# Patient Record
Sex: Male | Born: 2000 | Hispanic: No | Marital: Single | Smoking: Never smoker
Health system: Southern US, Community
[De-identification: ages and names within clinical notes are randomized; demographics above are authoritative.]

## PROBLEM LIST (undated history)

## (undated) MED ORDER — LIDOCAINE ATOMIZER SYRINGE 4% SOLUTION (~~LOC~~)
0.5000 mL | Freq: Once | Status: AC
Start: 2023-03-26 — End: 2023-03-26

## (undated) MED ORDER — OXYMETAZOLINE HCL 0.05 % NA SOLN
2.0000 | Freq: Once | NASAL | Status: AC
Start: 2023-03-26 — End: 2023-03-26

## (undated) MED ORDER — LIDOCAINE VISCOUS 2 % MT SOLN
15.0000 mL | Freq: Once | OROMUCOSAL | Status: AC
Start: 2023-03-26 — End: 2023-03-26

---
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
## 2021-01-29 DIAGNOSIS — R1312 Dysphagia, oropharyngeal phase: Secondary | ICD-10-CM | POA: Diagnosis not present

## 2021-01-29 DIAGNOSIS — R070 Pain in throat: Secondary | ICD-10-CM | POA: Diagnosis not present

## 2021-02-22 ENCOUNTER — Other Ambulatory Visit: Payer: Self-pay | Admitting: Family Medicine

## 2021-02-22 ENCOUNTER — Emergency Department (INDEPENDENT_AMBULATORY_CARE_PROVIDER_SITE_OTHER): Admitting: Student in an Organized Health Care Education/Training Program

## 2021-02-22 ENCOUNTER — Ambulatory Visit (INDEPENDENT_AMBULATORY_CARE_PROVIDER_SITE_OTHER)

## 2021-02-22 VITALS — BP 109/66 | HR 55 | Temp 98.7°F | Resp 18

## 2021-02-22 DIAGNOSIS — H6091 Unspecified otitis externa, right ear: Secondary | ICD-10-CM

## 2021-02-22 DIAGNOSIS — H6123 Impacted cerumen, bilateral: Secondary | ICD-10-CM

## 2021-02-22 DIAGNOSIS — H938X3 Other specified disorders of ear, bilateral: Secondary | ICD-10-CM

## 2021-02-22 LAB — COVID-19 CORONAVIRUS DETECTION ASSAY ASYMPTOMATIC: COVID-19 Coronavirus by PCR – External Lab: NOT DETECTED

## 2021-02-22 LAB — COVID-19 RAPID NAAT (POCT): COVID-19 Rapid Assay (POCT): NOT DETECTED

## 2021-02-22 MED ORDER — NEOMYCIN-POLYMYXIN-HC 3.5-10000-1 OT SUSP
3.0000 [drp] | Freq: Four times a day (QID) | OTIC | 0 refills | Status: AC
Start: 2021-02-22 — End: 2021-03-01

## 2021-02-22 NOTE — Patient Instructions (Signed)
Please take drops as prescribed.  And take over-the-counter decongestants.  Please follow-up with primary care if he continued to have ear pain or drainage.

## 2021-02-22 NOTE — Interdisciplinary (Signed)
Called pt for intake questions. Pt waiting in car.

## 2021-02-22 NOTE — Progress Notes (Signed)
Urgent Care Note    CC :   Chief Complaint   Patient presents with    Ear Pain     Bilat ear pain x2d    Congestion     x2d       HPI :   20 year old  male who presents with bilateral ear congestion and nasal congestion.  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  States that since getting off the plane 2 days ago he has had ear congestion and as well as a mild cough and nasal congestion.  No fever no shortness of breath no chest pain no rashes patient did have ear infections as a child but none recently.  Did not swim in the ocean.  No other medical complaints.      Past Medical History : No past medical history on file.    Past Surgical history :   No past surgical history on file.         What To Do With Your Medications      as of February 22, 2021  3:07 PM     You have not been prescribed any medications.         Allergies :  Patient has no known allergies.    Social History :   Social History     Socioeconomic History    Marital status: Single     Spouse name: Not on file    Number of children: Not on file    Years of education: Not on file    Highest education level: Not on file   Occupational History    Not on file   Tobacco Use    Smoking status: Not on file    Smokeless tobacco: Not on file   Substance and Sexual Activity    Alcohol use: Not on file    Drug use: Not on file    Sexual activity: Not on file   Other Topics Concern    Not on file   Social History Narrative    Not on file     Social Determinants of Health     Financial Resource Strain: Not on file   Food Insecurity: Not on file   Transportation Needs: Not on file   Physical Activity: Not on file   Stress: Not on file   Social Connections: Not on file   Intimate Partner Violence: Not on file   Housing Stability: Not on file       Family history  :  No family history on file.      Review of Systems   As per HPI, all other pertinent systems reviewed and negative.      Physical Exam :     02/22/21  1452   BP: 109/66   Pulse: 55   Resp: 18   Temp: 98.7 F (37.1 C)   SpO2: 100%     Vitals signs noted and reviewed.  GENERAL APPEARANCE: nad, A&O x 3   HEENT: no external evidence of trauma, mucous membranes moist bilateral cerumen impaction   CHEST : no obvious deformity  RESP: normal effort and rate  EXTREMITIES: No significant deformity or joint abnormality.  NEUROLOGICAL: CN II-XII grossly intact. Moving all extremities, following commands appropriately, gait normal  SKIN:    Warm and dry  PSYCHIATRIC: A&Ox3.  Normal affect.              Clinical  Decision Making  Previous or care everywhere records were reviewed, if available.  Lab studies ordered:  COVID point of care  Radiology ordered:  None  Referred to ED: no    Patient presents with bilateral ear congestion as well as nasal congestion.  COVID test negative.  Patient ears lavaged in afterward patient does have some irritation in the bilateral ear canals possible early otitis externa on the right side.  Will place patient on ear drops and recommend over-the-counter decongestant to help with nasal congestion and possible mild viral illness.  All results discussed with patient at bedside and with patient's mother.  Patient discharged with strict return precautions.    This note was created using voice recognition technology.  Due to environmental circumstances and may contain errors.  The errors include but are not limited to grammatical errors, punctuation errors, spelling errors, etc.

## 2021-05-30 DIAGNOSIS — H52223 Regular astigmatism, bilateral: Secondary | ICD-10-CM | POA: Diagnosis not present

## 2021-06-13 DIAGNOSIS — G5620 Lesion of ulnar nerve, unspecified upper limb: Secondary | ICD-10-CM | POA: Diagnosis not present

## 2021-06-14 DIAGNOSIS — G5621 Lesion of ulnar nerve, right upper limb: Secondary | ICD-10-CM | POA: Diagnosis not present

## 2021-06-14 DIAGNOSIS — G5620 Lesion of ulnar nerve, unspecified upper limb: Secondary | ICD-10-CM | POA: Insufficient documentation

## 2021-06-14 DIAGNOSIS — G5622 Lesion of ulnar nerve, left upper limb: Secondary | ICD-10-CM | POA: Diagnosis not present

## 2021-06-14 DIAGNOSIS — M5412 Radiculopathy, cervical region: Secondary | ICD-10-CM | POA: Diagnosis not present

## 2021-06-14 DIAGNOSIS — R200 Anesthesia of skin: Secondary | ICD-10-CM | POA: Diagnosis not present

## 2021-07-10 ENCOUNTER — Telehealth (INDEPENDENT_AMBULATORY_CARE_PROVIDER_SITE_OTHER): Payer: Self-pay

## 2021-07-10 NOTE — Telephone Encounter (Signed)
IT consultant from [facility]. Unable to process, missing information. Fax has been scanned to media. (Medical Records)

## 2021-07-15 ENCOUNTER — Telehealth (INDEPENDENT_AMBULATORY_CARE_PROVIDER_SITE_OTHER): Payer: Self-pay

## 2021-07-15 DIAGNOSIS — R29898 Other symptoms and signs involving the musculoskeletal system: Secondary | ICD-10-CM

## 2021-07-15 DIAGNOSIS — R200 Anesthesia of skin: Secondary | ICD-10-CM

## 2021-07-15 NOTE — Telephone Encounter (Signed)
Patient is being referred to neurology  for upper extremity pain/ weakness and numbness     Referring Provider: self referred    Patients PCP (update PCP on chart if needed) : n/a    Please review and advise on how to proceed with patient    Internal or External Referral external         If external referral- were referral & notes scanned yes    Authorization expiration date: n/a     Best Call Back Number: [phone]    Best Call Back Time: anytime    Is it ok to leave a message? yes     Was pt informed of turnaround for review? yes      Father states he was already seen by general neurology and pt needs to be seen by neuromuscular

## 2021-07-16 NOTE — Telephone Encounter (Signed)
[facility] called pt to inform message below, no answer, and not able to leave voicemail as mailbox is full. If pt calls back, inform message below, pt is currently scheduled with General Neurology but per message below, sports medicine is better option.     Noting as Pharmacist, hospital

## 2021-07-17 ENCOUNTER — Encounter (INDEPENDENT_AMBULATORY_CARE_PROVIDER_SITE_OTHER): Payer: Self-pay | Admitting: Hospital

## 2021-07-19 ENCOUNTER — Other Ambulatory Visit: Payer: Self-pay

## 2021-07-19 ENCOUNTER — Encounter (INDEPENDENT_AMBULATORY_CARE_PROVIDER_SITE_OTHER): Payer: Self-pay | Admitting: Nurse Practitioner

## 2021-07-19 ENCOUNTER — Ambulatory Visit (INDEPENDENT_AMBULATORY_CARE_PROVIDER_SITE_OTHER): Admitting: Nurse Practitioner

## 2021-07-19 VITALS — BP 102/67 | HR 51 | Temp 97.1°F | Ht 65.87 in

## 2021-07-19 DIAGNOSIS — M5412 Radiculopathy, cervical region: Secondary | ICD-10-CM

## 2021-07-19 DIAGNOSIS — H0011 Chalazion right upper eyelid: Secondary | ICD-10-CM

## 2021-07-19 MED ORDER — MULTIVITAMIN ADULT PO: 1.0000 | Freq: Every day | ORAL | Status: AC

## 2021-07-19 NOTE — Progress Notes (Signed)
[facility]     Subjective   Elijah Park is a 21 year old male who presents to clinic for Eye Problem (RT eye stye x 5 months )      HPI   Pt c/o stye to R upper eyelid x71mo  Has tried warm compresses multiple times a day w no relief   Stye has become firm, minimal pain but feels it   Denies interference w vision   No associated swelling or eye pain     Also requesting referral to ortho  H/o back trauma after doing a back flip 10/2020, fx'd T5-6, since then still having pain and new n/t to arms.  [place]ly [place] month [place] and would like a referral for f/u here  Pt also tried making a neurology appt but received a call back from the clinic stating that an ortho referral would be more appropriate  No acute changes or concerns today        PHQ9 TOTAL SCORE 07/19/2021   PHQ9 Patient Summary Score (calculated) 11     [place] PHQ9 DEPRESSION QUESTIONNAIRE 07/19/2021   Interest 2   Depressed 2   Sleep 3   Energy 0   Appetite 2   Failure 2   Concentration 0   Movement 0   Suicide 0   Summary(Manual) --   Summary(Calculated) 11   Functional Not difficult at all   pt denies active SI/HI today.  Declines CAPS referral or a SM providing information regarding CAPS resources.  Declines to further discuss mental health     REVIEW OF SYSTEMS      Review of Systems:  General - no fevers, chills, fatigue, unintentional weight loss, headache  Lungs - no cough, shortness of breath, hemoptysis, wheezing  Heart - no chest pain, pressure, irregular heartbeat/palpitations, leg swelling  GI- no abdominal pain, nausea, vomiting, diarrhea, bloating, rectal pain, rectal bleeding, constipation   Neurologic -+n/t no weakness, roblems walking  MusculoSkeletal -+back pain  no new joint pain, joint swelling, or warmth/stiffness  Skin - +stye no rash, no changing skin lesions, itching  Psychiatric - no depression, behavior changes, significant anxiety or sleep disturbance      Outpatient  Medications Prior to Visit   Medication Sig Dispense Refill   . Multiple Vitamin (MULTIVITAMIN ADULT PO) 1 tablet by Oral route daily.       No facility-administered medications prior to visit.     Immunization History   Administered Date(s) Administered   . (Chicken Pox) Varicella Vaccine 04/11/2003, 04/29/2007   . COVID-19 ([employer] Purple Cap >= 12 Years 08/17/2019, 09/07/2019, 06/04/2020   . HPV Unspecified Formulation 05/28/2016, 06/04/2017   . Hep-A Ped/Adol 2 Dose Schedule 05/22/2005, 05/06/2006   . Hepatitis B Vaccine Adjuvanted (Adult 18 Yrs and Older) 12003/06/11 05/09/2002, 01/11/2003   . Inactivated Polio Vaccine (IPV) 06/21/2002, 08/10/2002, 04/11/2003, 04/29/2007   . MMR 04/11/2003, 04/29/2007   . Meningococcal B (Bexsero) 2-dose vaccine 07/12/2018, 08/03/2019   . Meningococcal Vaccine 05/16/2013, 07/12/2018   . Pneumococcal 13 Vaccine (PREVNAR-13) 07/12/2003   . Tdap 05/16/2013     No Known Allergies  There are no problems to display for this patient.    No past medical history on file.  No past surgical history on file.  No family history on file.  No family status information on file.     Objective:  Vitals:    07/19/21 09323  BP: 102/67   BP Location: Left arm   BP Patient Position: Sitting   BP cuff size: Regular   Pulse: 51   Temp: 97.1 F (36.2 C)   TempSrc: Oral   Height: 5' 5.87" (1.673 m)     No LMP for male patient.  There is no height or weight on file to calculate BMI.  '@LABS'$ @    Wt Readings from Last 5 Encounters:   No data found for Wt     Blood Pressure   07/19/21 102/67   02/22/21 109/66         PHYSICAL EXAMINATION      Physical Exam  Constitutional:       Appearance: Normal appearance.   Eyes:      Extraocular Movements: Extraocular movements intact.      Conjunctiva/sclera: Conjunctivae normal.      Pupils: Pupils are equal, round, and reactive to light.     Cardiovascular:      Rate and Rhythm: Normal rate.   Pulmonary:      Effort: Pulmonary effort is normal.   Neurological:       Mental Status: He is alert.   Psychiatric:         Mood and Affect: Mood normal.         Behavior: Behavior normal.            Labs:  Results for orders placed or performed in visit on 02/22/21   COVID-19 Coronavirus Detection Assay Asymptomatic   Result Value Ref Range    COVID-19 Coronavirus by PCR - External Lab Not Detected      Imaging:  No results found.      ASSESSMENT AND PLAN    1. Chalazion of right upper eyelid  - Consult/Referral to Ophthalmology Clinic    2. Cervical radiculopathy  - Consult/Referral to Orthopedics      There are no Patient Instructions on file for this visit.      Patient Instruction: See Patient Education Section    Medication Review:  Medications reviewed with patient and medication list reconciled.  Over the counter medications, herbal therapies and supplements reviewed.  Patient's understanding and response to medications assessed.   Barriers to medications assessed and addressed.   Risks, benefits, alternatives to medications reviewed.      No barriers to learning, verbalizes understanding of teaching and instructions.    Health Maintenance   Topic Date Due   . COVID-19 Vaccine (4 - Booster for Pfizer series) 07/30/2020   . Influenza (1) Never done   . [place] TDAP FOR STUDENT COMPLIANCE  Completed   . [place] VARICELLA FOR STUDENT COMPLIANCE  Completed   . [place] MMR FOR STUDENT COMPLIANCE  Completed   . [place] MENINGOCOCCAL FOR STUDENT COMPLIANCE  Completed         FOLLOW UP     Return or call for any problems, new or worse symptoms: phone [phone]     Call 911 or go to the ER if an emergency  ER precautions discussed including but not limited to worsening pain, chest pain, shortness of breath, significant weakness or dizziness, severe vomiting or diarrhea.   Go to the  Urgent Care on [facility] in [facility] or [place] or ANY local urgent care/ER for new/worsening problems or if having problems after-hours    Future Appointments   Date Time Provider  [place]   12/04/2021  8:00 AM Marva Panda, MD EX[facility]  Tyannah Sane, NP

## 2021-07-23 ENCOUNTER — Other Ambulatory Visit (HOSPITAL_COMMUNITY): Payer: Self-pay

## 2021-07-23 ENCOUNTER — Encounter (HOSPITAL_BASED_OUTPATIENT_CLINIC_OR_DEPARTMENT_OTHER): Payer: Self-pay | Admitting: Nurse Practitioner

## 2021-07-23 DIAGNOSIS — M549 Dorsalgia, unspecified: Secondary | ICD-10-CM

## 2021-08-01 ENCOUNTER — Encounter (HOSPITAL_BASED_OUTPATIENT_CLINIC_OR_DEPARTMENT_OTHER): Payer: Self-pay | Admitting: Nurse Practitioner

## 2021-08-01 ENCOUNTER — Ambulatory Visit: Attending: Nurse Practitioner | Admitting: Nurse Practitioner

## 2021-08-01 ENCOUNTER — Other Ambulatory Visit: Payer: Self-pay

## 2021-08-01 VITALS — BP 115/62 | HR 53 | Temp 97.2°F | Resp 16 | Ht 65.87 in | Wt 108.0 lb

## 2021-08-01 DIAGNOSIS — G5620 Lesion of ulnar nerve, unspecified upper limb: Secondary | ICD-10-CM | POA: Diagnosis not present

## 2021-08-01 DIAGNOSIS — G5623 Lesion of ulnar nerve, bilateral upper limbs: Secondary | ICD-10-CM

## 2021-08-01 DIAGNOSIS — M5412 Radiculopathy, cervical region: Secondary | ICD-10-CM | POA: Insufficient documentation

## 2021-08-01 DIAGNOSIS — M5410 Radiculopathy, site unspecified: Secondary | ICD-10-CM | POA: Insufficient documentation

## 2021-08-01 DIAGNOSIS — R29898 Other symptoms and signs involving the musculoskeletal system: Secondary | ICD-10-CM | POA: Insufficient documentation

## 2021-08-01 NOTE — Progress Notes (Signed)
Division of Spine Surgery          Requesting Provider: Clarnce Flock    Reason for Visit: New Patient (Cervical radiculopathy )      History Of Present Illness: 21 year old male complains of bilateral grip weakness which has progressed since December as well as some neck pain which started recently.   Starting May 09, 2021 he started to notice issues gripping and was dropping things. For example holding weights in the gym was hard for him. He has significant neck stiffness and loss of range of motion as well which started in January. He has numbness in the ulnar nerve distribution into the forearms described as pins and needles in the bilateral hands. This comes and goes and is often described as being worse at night when he is sleeping with his elbow bent. A change in position relieves his symptoms.     EMG done 06/14/21.    He is from [place] where he was getting treatment following his T5/6 fractures last may but is now a Ship broker at [employer].   May 2022 fractured T5 and T6. He back flipped and landed on his head. He still has some tenderness in this area     Denies issues with gait or dexterity.   The patient denies changes in bowel or bladder function.    He has done no physical therapy since the onset of symptoms or the injury.       Prior Treatments:   Oral Medications: tried gabapentin but this made his arms really tired and did not give relief.  Physical Therapy: none    Chiropractic: -  Acupuncture: -  Other: none  Injections: none  Surgery: -       Past Medical History:  has no past medical history on file.    Past Surgical History:  has no past surgical history on file.    Medications:   Current Outpatient Medications   Medication Sig   . Multiple Vitamin (MULTIVITAMIN ADULT PO) 1 tablet by Oral route daily.     No current facility-administered medications for this visit.       Allergies: No Known Allergies    Social History:  reports that he has never smoked. He has never used smokeless  tobacco.    Family History: family history is not on file.      Physical Examination:  CONSTITUTIONAL: Well appearing and well groomed. PSYCHOLOGICAL: Alert and oriented and appropriate to situation. INTEGUMENT: Intact. VASCULAR: Radial and pedal pulses are intact and symmetric. EXTREMITIES: Bilateral upper and lower extremities have good range of motion and no significant deformities. GAIT: Brisk with good coordination.     CERVICAL SPINE: Nontender to palpation. Spurling's test negative. Sensation intact of the upper extremities.    RANGE OF MOTION (in degrees)       RT LT  Lateral motion   80  80  Lateral bend   45  45  Flexion 40 degrees. Extension 35 degrees. Reports some mild pain with lateral range of motion extremes.     MOTOR    RT LT  Trapezius (C3/4)  5/5  5/5  Deltoid (C5)   5/5  5/5  Bicep (C5/6)                           5/5       5/5  Wrist extensor (C6)  5/5  5/5   Wrist flexor (C7)  5/5  5/5  Tricep (C7)                         5/5  5/5  Grip (C8)   4+/5  4+/5  Intrinsics (T1)   5/5  5/5    DEEP TENDON REFLEXES       RT  LT  Bicep (C5/6)   2+  2+  Brachioradialis (C6)  2+  2+  Tricep (C7)   2+  2+    PATHOLOGIC REFLEXES      RT LT  Clonus    Neg Neg  Hoffmans   Neg Neg    THORACIC SPINE  Mild paraspinal muscle tenderness. Alignment - normal, no paravertebral muscle fullness. Sensation intact.    He does have positive elbow flexion test with mild Tinel's at the medial elbows bilaterally.  He has negative Tinel's at the volar wrist and negative carpal tunnel compression test.    IMAGING STUDIES:     MRI CERVICAL SPINE OSH:        MRI THORACIC SPINE OSH:      EMG 06/14/21 OSH:      IMPRESSION:   C8 radiculopathies per EMG without clinical evidence of etiology on MRI  Mild cubital tunnel syndrome bilateral  History of T5 and T6 vertebral fractures May 2022  Decreased grip strength    PLAN:   I had a long discussion with him today regarding his diagnosis and after a discussion of the natural history of  this, as well as surgical options.  He has interesting presentation with primary complaints of decreased grip strength bilaterally.  An EMG showed C8 radiculopathies bilaterally however clinically on his MRI he has no evidence of stenosis or nerve compression.  He does have some mild cubital tunnel bilaterally.  His primary complaints of hand numbness are in the night which resolve with positional changes which makes me think that his symptoms are more related to cubital tunnel.  The grip strength is difficult to decipher given his MRI findings.    At this point I would recommend that he start physical therapy to see if he gets benefit and his symptoms improved.  In addition moving forward I may refer him to an upper extremity specialist for their input on his cubital tunnel.  We discussed cubital tunnel exercises as well as maintaining elbow extension specifically at night.    All questions were answered and Elijah Park understood and was satisfied with this plan.     FOLLOWUP: Return to clinic 2-3 months or sooner as needed. The patient is encouraged to call us with any questions or problems in the interim. Our contact numbers were given to the patient.     On follow up he  will not need repeat radiographs.    I spent a total of 45 minutes today to provide care for this patient. This includes time spent prior to, during, and after the patient's appointment to:   Review the patient's past medical history as well as any relevant prior testing/laboratory/imaging results in preparation for the visit.    Obtain an adequate history and understanding of the patient's chief complaint.   Perform the necessary examination and/or order any tests, labs, or imaging for further evaluation.   Discuss the plan and any differential/diagnoses with the patient. Counsel/educate the patient if needed.   Coordinate care for the patient if needed.     Kandra Nicolas, NP   Orthopaedic Spine Surgery  Dictation software  disclaimer: The note above was dictated, at least in part, with Dragon dictation software. Although the note was thoroughly reviewed and edited for accuracy, occasional errors of word substitution or omission may occur.

## 2021-08-05 ENCOUNTER — Encounter (HOSPITAL_COMMUNITY): Payer: Self-pay

## 2021-08-13 ENCOUNTER — Telehealth (HOSPITAL_BASED_OUTPATIENT_CLINIC_OR_DEPARTMENT_OTHER): Payer: Self-pay | Admitting: Nurse Practitioner

## 2021-08-13 NOTE — Telephone Encounter (Signed)
Tressia Miners NP ending clinic at 3:00 4/27. Rescheduled patient to 10:30am. Called patient, cannot leave vm, sent mychart message informing of change.

## 2021-08-15 NOTE — Interdisciplinary (Signed)
Physical Therapy Evaluation    Ordering Physician: Judeen Hammans    Visit Diagnosis:    ICD-10-CM ICD-9-CM    1. Cubital tunnel syndrome, unspecified laterality  G56.20 354.2       2. Radiculopathy affecting upper extremity  M54.10 723.4       3. Decreased grip strength  R29.898 729.89         Visit Number: 1     Insurance: [place] [employer]    No past medical history on file.    No past surgical history on file.    Allergies: Patient has no known allergies.    Current Outpatient Medications   Medication Sig Dispense Refill   . Multiple Vitamin (MULTIVITAMIN ADULT PO) 1 tablet by Oral route daily.       No current facility-administered medications for this visit.     Start of Care: 08/15/2021           SUBJECTIVE    History of presenting condition: Started experiencing bilateral grip weakness that has been worsening since Dec 2022. Pt was diagnosed with cubital tunnel syndrome.    May 2022 fractured T5 and T6. He back flipped and landed on his head. He still has some tenderness in this area. Pt is not sure if there is correlation with the throacic spine fracture     Recent onset of neck pain with decrease range of motion and stiffness only when the hand symptoms are worse. Numbness in ulnar nerve distribution into the forearms. Symptom worse at night when he is sleeping with his elbow bent. Morning are the worst. Sometimes if he grips tightly the fingers spasm. Stress ball worsened symptoms     He is from [place] where he was getting treatment following his T5/6 fractures last may but is now a Ship broker at [employer].     Symptom progression since onset: stable since onset     Pain levels:  Pain Location: Right > Left hand numbness     Pain Quality: weakness, lack of strength   Intensity: Current:  5/10      At best: 4/10                 At worst: 8/10 (after sleeping with elbow bent)  Frequency: Intermittent    Numbness and Tingling: yes - on the 4th and 5th digit   What increases symptoms:  sleeping with elbows  bent   What decreases symptoms:  elbow brace (stopped in 2 days due to affecting sleep)   Pain Irritability: Moderate  24 Hour Pattern: Yes- morning   Sleep Disturbance: No    Previous treatment for condition: none   Diagnostic tests: EMG  and MRI   EMG testing showed C8 radiculopathies, but no evidence of stenosis or nerve compression on MRI. Mild cubital tunnel bilaterally   Occupation: Ship broker at [employer] Status: Student  Prior level of function: Independent-No Deficits  Functional limitations: sleeping, loss of grip strength- hard to grip phone, glasses    Fall history: No  Patient goals:  improve grip strength, decrease pain in the morning    Other subjective/PMH:  May 2022 fractured T5 and T6  Therapy Precautions/Contraindications: None     Current exercise routine: 6x a week at the gym     Night symptoms: No    Vertebral Artery Insufficiency Screen:   5 D's (Diplopia, Dizziness, Drop Attacks, Dysarthria, Dysphagia): No    Headaches? Pins and needles back of the head   Symptom  with Cough/Sneeze - No  Dominant Side: right-hand dominant    OBJECTIVE    OBJECTIVE:     Posture/Observation:   r scap downwardly rotrated, ioncreased thoracic kyphosis forward head     Baseline pain level: 5/10     Cervical AROM:  C/S Flexion: 50, minimal reversal of cervical lordosis   C/S Extension: WNL  C/S R Rotation: 55  C/S L Rotation: 65  C/S R Sidebend: 25% limited, hypomobile mid cervical spine    C/S L Sidebend: 25% limited, hypomobile mid cervical spine      Thoracic AROM:   Right rotation: 25% limited   Left rotation: WNL      SHOULDER screen:   Shoulder AROM: all WNL and pain free        Strength:   Deep Neck Flexors: 11 secs, anterior translation   Mid trap: Right 4 , Left 4  Low trap: Right 4 , Left 4     Length Tests:   Pec Minor: R stiff , L stiff     Palpation: increased tone of Left > Right upper trap with reports of tenderness       Accessory Mobility:  C/S Upglides: unremarkable     Special Tests:    Spurling's: negative  Compression: negative   Distraction: negative   ULTT Ulnar Nerve: positive   Tinel's sign: positive for B cubital tunnel     Neurological:  Myotome:  C4 Shoulder Elevation: R 5 , L 5  C5 Shoulder Abd: R 5 , L 5  C6 Wrist Ext: R 5 , L 5  C7 Wrist Flex: R 5 , L 5  C8 Finger Flex: R 4 , L 4 weak and cramping, spasm   T1 Finger Abd: R 5 , L 5  Dermatome: unremarkable  Deep Tendon Reflexes:   C5 Biceps: R 2+ , L 2+  C6 Brachioradialis: R 2+ , L 2+  C7 Triceps: R 2+ , L 2+      HOME EXERCISE PROGRAM    Access Code: [email or web address]  URL: [email or web address]  Date: 08/16/2021  Prepared by:    Program Notes  Look into getting a longer elbow brace for night time     Wearing a elbwo support sleeve during the day       Exercises  Ulnar Nerve Flossing - 3 x daily - 5 x weekly - 2 sets - 10 reps - 1 hold  Seated Single Digit Intrinsic Stretch - 1 x daily - 5 x weekly - 2 sets - 3 secs hold  Seated Multiple Digit Intrinsic Stretch - 1 x daily - 5 x weekly - 2 sets - 8 reps - 1 hold  Sidelying Open Book Thoracic Lumbar Rotation and Extension - 1 x daily - 5 x weekly - 1 sets - 10 reps - 1 hold  Cervical Retraction with Overpressure - 3 x daily - 5 x weekly - 1 sets - 10 reps - 3 secs hold      MedBridge access code: [email or web address]  Rehabilitation Potential: Good    Patient presents with decrease in Right > Left grip strength since Dec 2022. Deficits include signs and symptoms consistent with cubital tunnel syndrome, limited cervical spine and thoracic spine rotation, and poor cervical spine posture, limiting the patient from activities that involved grip strength such as holding cell phone and picking up glasses .  Patient will benefit from skilled therapy to address impairments and improve function. Patient was given a home  exercise program to begin addressing evaluation findings and was educated on how to perform effectively and safely. Patient was instructed to perform home exercise program as  tolerated. Discussed evaluation findings with patient and educated on therapy potential and plan of care.    Plan for next visit:   Check if patient was able to get new elbow sleeve   Review HOME EXERCISE PROGRAM    Progress periscapular strengthening- prone I   Add supien chin tuck with head lift       Goals  Goal 1 Impairment: Education need  Goal 1: Patient able to return demonstrate Home Exercise Program independently to enable patient to achieve stated functional goals     Number of Visits: 1-3  Status :New       Goal 2: Increase in cervical rotation to the R by at least 5 degs  Number of Visits: 5-7  Status: New                   Long Term Goal: Equal grip strength of R and L to return to all ADLs such as holding the phone and glasses  Number of Visits: 10-14  Status: New          Long Term Goal Outcome: Patient will decrease NDI by MCID of at least 5 points to reflect clinically significant positive change in function since starting therapy  Number of Visits: 10-14  Status: New    PLAN  Continue therapy to address: Activity tolerance limitation;Pain;Range of motion limitation;Strength impairment  Treatment Frequency: 1 time per week  Treatment Duration: 12 weeks       Type of Eval  Low Complexity [id number]): Completed  Therapeutic Procedures  Therapeutic exercise  [phone]) : Flexibility exercises;Home Exercise Program (HEP) demonstration and performance;Strengthening exercises     Total TIMED Treatment (min) : 25    Therapeutic exercise  : review and demo of HEP (see above)  Self-Care/ADL training [phone]) : Patient education     Total TIMED Treatment (min) : 15    Self-Care/ADL training : Pt ed in anatomy and physiology associated with the cervical spine, current sx production, treatment theory and the beneft of skilled PT to reach rehab and personal goals. Recommendation on longer elbow splint and to wera elbow cushion sleeve during the day                  Treatment Time   Total TIMED Treatment  (min):  45  Total Treatment Time (min): 60    Therapist and Patient discussed treatment plan for     ICD-10-CM ICD-9-CM    1. Cubital tunnel syndrome, unspecified laterality  G56.20 354.2       2. Radiculopathy affecting upper extremity  M54.10 723.4       3. Decreased grip strength  R29.898 729.89        and all parties agreeable to the following recommendations.    Planned Therapy Interventions: Manual Therapy, Neuromuscular Re-Education, Patient Education, Therapeutic Activity and Therapeutic Exercise    Goals of treatment reviewed with [place]? Yes  Home Exercise Program issued? Yes  Education provided: Home Exercise Program

## 2021-08-16 ENCOUNTER — Ambulatory Visit
Attending: Rehabilitative and Restorative Service Providers" | Admitting: Rehabilitative and Restorative Service Providers"

## 2021-08-16 ENCOUNTER — Other Ambulatory Visit: Payer: Self-pay

## 2021-08-16 DIAGNOSIS — R29898 Other symptoms and signs involving the musculoskeletal system: Secondary | ICD-10-CM | POA: Diagnosis not present

## 2021-08-16 DIAGNOSIS — M5410 Radiculopathy, site unspecified: Secondary | ICD-10-CM

## 2021-08-16 DIAGNOSIS — G5620 Lesion of ulnar nerve, unspecified upper limb: Secondary | ICD-10-CM

## 2021-08-19 ENCOUNTER — Encounter (INDEPENDENT_AMBULATORY_CARE_PROVIDER_SITE_OTHER): Payer: Self-pay | Admitting: Ophthalmology

## 2021-08-19 ENCOUNTER — Other Ambulatory Visit: Payer: Self-pay

## 2021-08-19 ENCOUNTER — Ambulatory Visit (INDEPENDENT_AMBULATORY_CARE_PROVIDER_SITE_OTHER): Admitting: Ophthalmology

## 2021-08-19 ENCOUNTER — Other Ambulatory Visit (INDEPENDENT_AMBULATORY_CARE_PROVIDER_SITE_OTHER)

## 2021-08-19 DIAGNOSIS — H0011 Chalazion right upper eyelid: Secondary | ICD-10-CM

## 2021-08-19 DIAGNOSIS — M5410 Radiculopathy, site unspecified: Secondary | ICD-10-CM | POA: Diagnosis not present

## 2021-08-19 DIAGNOSIS — R29898 Other symptoms and signs involving the musculoskeletal system: Secondary | ICD-10-CM | POA: Diagnosis not present

## 2021-08-19 DIAGNOSIS — G5620 Lesion of ulnar nerve, unspecified upper limb: Secondary | ICD-10-CM | POA: Diagnosis not present

## 2021-08-19 MED ORDER — NEOMYCIN-POLYMYXIN-DEXAMETH 3.5-10000-0.1 OP OINT
1.0000 | TOPICAL_OINTMENT | Freq: Two times a day (BID) | OPHTHALMIC | 0 refills | Status: AC
Start: 2021-08-19 — End: 2021-08-26
  Filled 2021-08-19: qty 3.5, 7d supply, fill #0

## 2021-08-19 NOTE — Progress Notes (Signed)
20yoM w no POHx, p/w chalazion x6 months that has not resolved. Is interested in incision+drainage.    General: patient alert and oriented x 3    IOP:     Pupils: no APD OU    EOM: full OU, no diplopia    CVF: full OU    ALLERGIES:  No Known Allergies    EYELID EXAM:  LIDS: +MGD, discrete subcutaneous round firm mass w/ mild erythema RUL 3x3 mm at margin    MRD1: 4, 4  MFD: 5, 5  Lagophthalmos: none OU      ASSESSMENT / PLAN:  1. Chalazion RUL  - Failed conservative medical management.  - Recommend chalazion excision/I&D     - The risks, benefits, and alternatives of chalazion surgery were discussed with the patient including but not limited pain, infection, bleeding, scar, need for further eye surgery, skin discoloration, pain, skin dimpling, and unexpected refractive changes requiring possible future surgery or contact lenses. The patient expressed full understanding of these risks and wanted to proceed with the surgery.     - maxitrol ointment BID x 10-14 days.  - warm compresses, lid hygiene    Blepharitis OU  - recommend lid hygiene, warm compresses    I saw and examined the patient, and discussed the case with the resident. I confirmed the examination findings. The assessment and plan are my own. The final examination findings, image interpretations, and plan as documented in the record represent my personal judgement and conclusions.      I reviewed and confirmed the above HPI.  I reviewed and confirmed the techs review of systems, past histories, and chief complaint.

## 2021-08-22 NOTE — Interdisciplinary (Signed)
PHYSICAL THERAPY DAILY TREATMENT NOTE    Ordering Physician: Cristobal Goldmann    Visit Diagnosis:    ICD-10-CM ICD-9-CM    1. Cubital tunnel syndrome, unspecified laterality  G56.20 354.2       2. Radiculopathy affecting upper extremity  M54.10 723.4       3. Decreased grip strength  R29.898 729.89         Visit Number: 2     Insurance: [place] [employer]    No past medical history on file.    No past surgical history on file.    Allergies: Patient has no known allergies.    Current Outpatient Medications   Medication Sig Dispense Refill    Multiple Vitamin (MULTIVITAMIN ADULT PO) 1 tablet by Oral route daily.      neomycin-polymyxin-dexamethasone (MAXITROL) 3.5-10000-0.1 ointment Place 1 strip into right eye 2 times daily for 7 days. 3.5 g 0     No current facility-administered medications for this visit.     Start of Care: 08/15/2021                   Weeks since Surgery/Injury/Onset (0 if N/A): 0        SUBJECTIVE    Subjective information: Elijah Park has not been able to get a new elbow brace to wear at night. The nerve glide makes his thumb feel twitchy    Pain levels:  No pain reported.    Patient stated goal: improve grip strength, decrease pain in the morning    OBJECTIVE    TREATMENT:    Manual: MFD triceps, medial forerarm, bilatearl (5' static, 10x with elbow flexion, 10x nerve gldide)     Therex:   Prone I  B horizontal shoulder abduction     HOME EXERCISE PROGRAM    Access Code: [email or web address]  URL: [email or web address]  Date: 08/23/2021  Prepared by:    Program Notes  Look into getting a longer elbow brace for night time     Wearing a elbwo support sleeve during the day       Exercises  Ulnar Nerve Flossing - 3 x daily - 5 x weekly - 2 sets - 10 reps - 1 hold  Seated Single Digit Intrinsic Stretch - 1 x daily - 5 x weekly - 2 sets - 3 secs hold  Seated Multiple Digit Intrinsic Stretch - 1 x daily - 5 x weekly - 2 sets - 8 reps - 1 hold  Seated Digit Tendon Gliding - 1 x daily - 5 x weekly - 2 sets - 15 reps -  1 hold  Sidelying Open Book Thoracic Lumbar Rotation and Extension - 1 x daily - 5 x weekly - 1 sets - 10 reps - 1 hold  Cervical Retraction with Overpressure - 3 x daily - 5 x weekly - 1 sets - 10 reps - 3 secs hold  Prone I - 1 x daily - 5 x weekly - 2 sets - 15 reps - 1 hold  Standing Shoulder Horizontal Abduction with Resistance - 1 x daily - 5 x weekly - 2 sets - 15 reps - 1 hold      MedBridge access code: [email or web address]    ASSESSMENT    Patient making modest progress as evidenced by good recall of the therapeutic exercises. Reinforced the importance of wearing the elbow brace when he goes to sleep. Patient presents with deficits involving cubital tunnel syndrome which demonstrates the need for continued skilled care to address impairments to  reach goals set in plan of care and to return to previous level of function.    Plan for next visit:   Continue manual tx  Review and progress postural exercises     Goals  Goal 1 Impairment: Education need  Goal 1: Patient able to return demonstrate Home Exercise Program independently to enable patient to achieve stated functional goals     Number of Visits: 1-3  Status :New       Goal 2: Increase in cervical rotation to the R by at least 5 degs  Number of Visits: 5-7  Status: New                   Long Term Goal: Equal grip strength of R and L to return to all ADLs such as holding the phone and glasses  Number of Visits: 10-14  Status: New          Long Term Goal Outcome: Patient will decrease NDI by MCID of at least 5 points to reflect clinically significant positive change in function since starting therapy  Number of Visits: 10-14  Status: New    PLAN  Continue therapy to address: Activity tolerance limitation;Pain;Range of motion limitation;Strength impairment  Treatment Frequency: 1 time per week  Treatment Duration: 12 weeks          Therapeutic Procedures  Manual therapy ([id number]) : Soft tissue mobilization     Total TIMED Treatment (min) : 23  Therapeutic exercise   (95621) : Strengthening exercises     Total TIMED Treatment (min) : 5                  Treatment Time   Total TIMED Treatment  (min): 30  Total Treatment Time (min): 28

## 2021-08-23 ENCOUNTER — Other Ambulatory Visit: Payer: Self-pay

## 2021-08-23 ENCOUNTER — Ambulatory Visit (HOSPITAL_BASED_OUTPATIENT_CLINIC_OR_DEPARTMENT_OTHER): Admitting: Rehabilitative and Restorative Service Providers"

## 2021-08-23 DIAGNOSIS — G5620 Lesion of ulnar nerve, unspecified upper limb: Secondary | ICD-10-CM | POA: Diagnosis not present

## 2021-08-23 DIAGNOSIS — R29898 Other symptoms and signs involving the musculoskeletal system: Secondary | ICD-10-CM

## 2021-08-23 DIAGNOSIS — M5410 Radiculopathy, site unspecified: Secondary | ICD-10-CM

## 2021-08-26 ENCOUNTER — Encounter (INDEPENDENT_AMBULATORY_CARE_PROVIDER_SITE_OTHER): Payer: Self-pay | Admitting: Ophthalmology

## 2021-08-26 ENCOUNTER — Telehealth (INDEPENDENT_AMBULATORY_CARE_PROVIDER_SITE_OTHER): Payer: Self-pay | Admitting: Ophthalmology

## 2021-08-26 NOTE — Telephone Encounter (Signed)
VM is full

## 2021-08-26 NOTE — Telephone Encounter (Signed)
Dear Elijah Park,    My name is Jamas Lav and I work with Dr. Oleta Mouse, in the Ophthalmology department. It was a pleasure assisting you at your last appointment.    I wanted to let you know that the results for the biopsy done on 08/19/2021, came back as benign, there are no signs of malignancy. If you have any questions for [place], please feel free to call us at [phone].     Thank You,    Jamas Lav

## 2021-09-06 DIAGNOSIS — M6281 Muscle weakness (generalized): Secondary | ICD-10-CM | POA: Diagnosis not present

## 2021-09-10 DIAGNOSIS — M6289 Other specified disorders of muscle: Secondary | ICD-10-CM | POA: Diagnosis not present

## 2021-09-10 DIAGNOSIS — M6281 Muscle weakness (generalized): Secondary | ICD-10-CM | POA: Diagnosis not present

## 2021-09-13 ENCOUNTER — Ambulatory Visit (HOSPITAL_BASED_OUTPATIENT_CLINIC_OR_DEPARTMENT_OTHER): Admitting: Rehabilitative and Restorative Service Providers"

## 2021-09-20 ENCOUNTER — Ambulatory Visit (HOSPITAL_BASED_OUTPATIENT_CLINIC_OR_DEPARTMENT_OTHER): Admitting: Rehabilitative and Restorative Service Providers"

## 2021-10-03 ENCOUNTER — Ambulatory Visit (HOSPITAL_BASED_OUTPATIENT_CLINIC_OR_DEPARTMENT_OTHER): Admitting: Nurse Practitioner

## 2021-10-14 DIAGNOSIS — M6281 Muscle weakness (generalized): Secondary | ICD-10-CM

## 2021-11-28 DIAGNOSIS — Z7183 Encounter for nonprocreative genetic counseling: Secondary | ICD-10-CM | POA: Diagnosis not present

## 2021-11-28 DIAGNOSIS — G7111 Myotonic muscular dystrophy: Secondary | ICD-10-CM | POA: Diagnosis not present

## 2021-12-04 ENCOUNTER — Ambulatory Visit (INDEPENDENT_AMBULATORY_CARE_PROVIDER_SITE_OTHER): Admitting: Student in an Organized Health Care Education/Training Program

## 2021-12-12 DIAGNOSIS — R0602 Shortness of breath: Secondary | ICD-10-CM | POA: Diagnosis not present

## 2021-12-12 DIAGNOSIS — G7100 Muscular dystrophy, unspecified: Secondary | ICD-10-CM | POA: Diagnosis not present

## 2022-01-01 DIAGNOSIS — R001 Bradycardia, unspecified: Secondary | ICD-10-CM | POA: Diagnosis not present

## 2022-01-01 DIAGNOSIS — R42 Dizziness and giddiness: Secondary | ICD-10-CM | POA: Diagnosis not present

## 2022-01-01 DIAGNOSIS — G7100 Muscular dystrophy, unspecified: Secondary | ICD-10-CM | POA: Diagnosis not present

## 2022-01-04 DIAGNOSIS — R001 Bradycardia, unspecified: Secondary | ICD-10-CM | POA: Diagnosis not present

## 2022-01-07 DIAGNOSIS — G7111 Myotonic muscular dystrophy: Secondary | ICD-10-CM | POA: Diagnosis not present

## 2022-01-13 DIAGNOSIS — G7111 Myotonic muscular dystrophy: Secondary | ICD-10-CM | POA: Insufficient documentation

## 2022-01-22 DIAGNOSIS — G4733 Obstructive sleep apnea (adult) (pediatric): Secondary | ICD-10-CM | POA: Diagnosis not present

## 2022-01-24 ENCOUNTER — Other Ambulatory Visit (HOSPITAL_COMMUNITY): Payer: Self-pay

## 2022-01-31 ENCOUNTER — Other Ambulatory Visit (HOSPITAL_COMMUNITY): Payer: Self-pay

## 2022-01-31 MED ORDER — BUPROPION HCL ER (SR) 100 MG PO TB12
100.0000 mg | ORAL_TABLET | Freq: Every day | ORAL | 3 refills | Status: AC
  Filled 2022-01-31: qty 30, 30d supply, fill #0

## 2022-02-19 ENCOUNTER — Other Ambulatory Visit (HOSPITAL_COMMUNITY): Payer: Self-pay

## 2022-02-19 MED ORDER — ONDANSETRON 4 MG PO TBDP
4.0000 mg | ORAL_TABLET | Freq: Four times a day (QID) | ORAL | 0 refills | Status: AC
  Filled 2022-02-19: qty 42, 11d supply, fill #0

## 2022-05-13 ENCOUNTER — Other Ambulatory Visit (HOSPITAL_COMMUNITY): Payer: Self-pay

## 2022-05-13 MED ORDER — MEXILETINE HCL 150 MG PO CAPS
ORAL_CAPSULE | ORAL | 5 refills | Status: AC
  Filled 2022-05-13: qty 90, 30d supply, fill #0
  Filled 2022-07-22: qty 90, 30d supply, fill #1

## 2022-05-16 ENCOUNTER — Other Ambulatory Visit (HOSPITAL_COMMUNITY): Payer: Self-pay

## 2022-06-16 ENCOUNTER — Other Ambulatory Visit: Payer: Self-pay

## 2022-06-16 MED ORDER — KETOCONAZOLE 2 % EX SHAM: 1.0000 | MEDICATED_SHAMPOO | CUTANEOUS | 0 refills | Status: AC

## 2022-07-22 ENCOUNTER — Other Ambulatory Visit (HOSPITAL_COMMUNITY): Payer: Self-pay

## 2022-09-01 ENCOUNTER — Other Ambulatory Visit (HOSPITAL_COMMUNITY): Payer: Self-pay

## 2022-09-01 MED ORDER — PREDNISOLONE ACETATE 1 % OP SUSP
OPHTHALMIC | 0 refills | Status: AC
  Filled 2022-09-01: qty 5, 7d supply, fill #0

## 2022-09-03 ENCOUNTER — Other Ambulatory Visit (HOSPITAL_COMMUNITY): Payer: Self-pay

## 2022-09-03 DIAGNOSIS — G7111 Myotonic muscular dystrophy: Secondary | ICD-10-CM | POA: Diagnosis not present

## 2022-09-03 MED ORDER — MIRTAZAPINE 7.5 MG PO TABS
7.5000 mg | ORAL_TABLET | Freq: Every day | ORAL | 3 refills | Status: DC
  Filled 2022-09-03: qty 90, 90d supply, fill #0

## 2022-09-03 MED ORDER — MEXILETINE HCL 150 MG PO CAPS
150.0000 mg | ORAL_CAPSULE | ORAL | 3 refills | Status: DC
  Filled 2022-09-03: qty 270, 90d supply, fill #0

## 2022-09-04 ENCOUNTER — Other Ambulatory Visit (HOSPITAL_COMMUNITY): Payer: Self-pay

## 2022-09-04 ENCOUNTER — Other Ambulatory Visit: Payer: Self-pay

## 2022-09-08 ENCOUNTER — Encounter (HOSPITAL_COMMUNITY): Payer: Self-pay | Admitting: Psychiatry

## 2022-09-08 ENCOUNTER — Ambulatory Visit (HOSPITAL_BASED_OUTPATIENT_CLINIC_OR_DEPARTMENT_OTHER): Payer: BC Managed Care – PPO | Admitting: Psychiatry

## 2022-09-08 VITALS — Wt 108.0 lb

## 2022-09-08 DIAGNOSIS — F0631 Mood disorder due to known physiological condition with depressive features: Secondary | ICD-10-CM | POA: Diagnosis not present

## 2022-09-08 NOTE — Progress Notes (Signed)
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  Patient struggled with chronic symptoms of fatigue, lack of motivation, not motivated and does not want to anything.  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  Patient noticed symptoms started to progress and now it is affecting on his daily functioning.  He has hypersomnia, muscle spasm, poor attention concentration.  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  He is worried he is failing and not doing very well in classes.  He admitted getting frustrated and irritable.  Last week he was visiting his parents in [place] and had an argument with his father when he was lying down on his bed and does not want to participate in daily chores.  His father asked him to get up and he got very upset and without telling his parents went to airport to fly back to [facility].  His connecting flight from [place] to [place] was canceled and he informed his mother and his sister brought him back home.  He reported since he was given the diagnosis of muscular dystrophy he had not doing very well in his life.  Sometime he get disappointed and discouraged when he read and did research on the prognosis.  His biggest concern is his school.  He want to drop out and take some online classes for artificial intelligence but he feel his family does not understand his rationale and thinking about this decision.  He admitted lot  of negative thoughts but he also rationalizes his thought process given the long-term prognosis of his illness.  He expressed his desire to do online courses on coding program and artificial intelligence but he feels there is a pressure from his family to finish his degree.  He is afraid that he will not able to succeed because grades are not very well.  He admitted lately more irritable, having impulsive behavior, anger and shutting down.  He reported some time he does not want to talk to his family member because they do not understand what he is going through physically.  He also more isolated, withdrawn, and seclusive.  Sometime he feel nothing is making him happy.  He is keeping fast during the month of Ramadan and that also bring down his energy.  Last month when he was in [facility] he had severe bouts of depression and he tried to cut his right arm with a blunt scissor.  There was no bleeding or injury.  Patient told it was not a suicidal attempt rather than frustration and anger.  In the past he has given Wellbutrin by primary care doctor and recently mirtazapine to help his depression and to help appetite.  He remembered Wellbutrin did not help but also admitted he took only 1 or 2 pill.  Patient told he does not like taking psychotropic medication because he does not want to alter his brain and thinking.  He reported that he is more close  to his friend and if he needs some help he can reach them out.  He does not want any sympathy or empathy from the friends, family members.  He has look into Gene trials for muscular dystrophy in [facility] and [facility] but these trials has not started yet.  Though he actually not looking for suicidal part but there are days when he feel sad, hopeless.  He admitted had tried therapy but that did not work because he feels therapist tell him to do stupid things which will not work for him.  He denies any drug use, alcohol, nightmares, flashback, history of physical  or sexual abuse.  He admitted lack of appetite, fatigue, energy and weight loss in recent months.  He denies any panic attack, crying spells.  He denies any mania or psychosis.  Associated Signs/Symptoms: Depression Symptoms:  depressed mood, hypersomnia, psychomotor retardation, fatigue, difficulty concentrating, loss of energy/fatigue, weight loss, decreased appetite, (Hypo) Manic Symptoms:  Impulsivity, Irritable Mood, Anxiety Symptoms:  Social Anxiety, Psychotic Symptoms:   no psychotic symptoms PTSD Symptoms: NA  Past Psychiatric History: No history of previous psychiatric history.  PCP tried Wellbutrin but he stopped after taking 1 or 2 pills.  Previous Psychotropic Medications: Yes   Substance Abuse History in the last 12 months:  No.  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  Family Psychiatric History: none  Family History: No family history on file.  Social History:   Social History   Socioeconomic History   Marital status: Single    Spouse name: Not on file   Number of children: Not on file   Years of education: Not on file   Highest education level: Not on file  Occupational History   Not on file  Tobacco Use   Smoking status: Never   Smokeless tobacco: Never  Substance and Sexual Activity   Alcohol use: Not on file   Drug use: Not on file   Sexual activity: Not on file  Other Topics Concern   Not on file  Social History Narrative   Not on file   Social Determinants of Health   Financial Resource Strain: Not on file  Food Insecurity: Not on file  Transportation Needs: Not on file  Physical Activity: Not on  file  Stress: Not on file  Social Connections: Not on file    Additional Social History: Patient born and raised in [place].  Parents are from [place].  Both parents are physician.  2 sisters are married.  He has a one younger sister who is at [employer] [place].  Patient finished high school and moved to [place] for college.  He had a good social network but lately he has been very isolated and seclusive.  Allergies:  No Known Allergies  Metabolic Disorder Labs: No results found for: "HGBA1C", "MPG" No results found for: "PROLACTIN" No results found for: "CHOL", "TRIG", "HDL", "CHOLHDL", "VLDL", "LDLCALC" No results found for: "TSH"  Therapeutic Level Labs: No results found for: "LITHIUM" No results found for: "CBMZ" No results found for: "VALPROATE"  Current Medications: Current Outpatient Medications  Medication Sig Dispense Refill   buPROPion ER [facility] SR)  100 MG 12 hr tablet Take 1 tablet (100 mg total) by mouth daily. 30 tablet 3   celecoxib (CELEBREX) 200 MG capsule Take 1 capsule (200 mg total) by mouth 2 (two) times daily. 60 capsule 2   cyclobenzaprine (FLEXERIL) 10 MG tablet Take 1 tablet by mouth at bedtime 30 tablet 0   gabapentin (NEURONTIN) 300 MG capsule TAKE 1 CAPSULE BY MOUTH AT BEDTIME FOR 7 DAYS THEN 1 CAPSULE TWICE DAILY. (Patient not taking: Reported on 09/14/2020) 60 capsule 2   ketoconazole (NIZORAL) 2 % shampoo Apply 1 Application topically 2 (two) times a week. 120 mL 0   mexiletine (MEXITIL) 150 MG capsule Take 1 capsule  by mouth daily for 7 days,  1 capsule 2 times daily for 7 days, 1 capsule every 8  hours**wait for Dr to call to start** 90 capsule 5   mexiletine (MEXITIL) 150 MG capsule Take 1 capsule (150 mg total) by mouth in the morning and 1 capsule (150 mg total) at noon and 1 capsule (150 mg total) in the evening. Take with meals. 270 capsule 3   mirtazapine (REMERON) 7.5 MG tablet Take 1 tablet (7.5 mg total) by mouth at bedtime. 90 tablet 3    Multiple Vitamin (MULTIVITAMIN) capsule Take by mouth.     ondansetron (ZOFRAN-ODT) 4 MG disintegrating tablet Dissolve 1 tablet (4 mg total) on top of the tongue 4 (four) times daily. 42 tablet 0   prednisoLONE acetate (PRED FORTE) 1 % ophthalmic suspension Instill 1 drop into each eye 4 times a day x 1 week, then twice a day  x 1 week. 5 mL 0   No current facility-administered medications for this visit.    Musculoskeletal: Strength & Muscle Tone: spastic and decreased Gait & Station: normal Patient leans: N/A  Psychiatric Specialty Exam: Review of Systems  Constitutional:  Positive for fatigue.    Weight 108 lb (49 kg).There is no height or weight on file to calculate BMI.  General Appearance: Casual and Fairly Groomed  Eye Contact:  Good  Speech:  Slow  Volume:  Decreased  Mood:  Depressed and Dysphoric  Affect:  Constricted and Depressed  Thought Process:  Descriptions of Associations: Intact  Orientation:  Full (Time, Place, and Person)  Thought Content:  Rumination  Suicidal Thoughts:  No  Homicidal Thoughts:  No  Memory:  Immediate;   Good Recent;   Good Remote;   Good  Judgement:  Fair  Insight:  Shallow  Psychomotor Activity:  Decreased  Concentration:  Concentration: Fair and Attention Span: Good  Recall:  Good  Fund of Knowledge:Good  Language: Good  Akathisia:  No  Handed:  Right  AIMS (if indicated):  not done  Assets:  Communication Skills Desire for Improvement Housing Transportation  ADL's:  Intact  Cognition: WNL  Sleep:   too much   Screenings:   Assessment and Plan: Kalum is a 22 year old approximately American male who diagnosed with muscular dystrophy in April 2023 with multiple neurological symptoms including fatigue, spasm, generalized weakness, gastroparesis, dysphagia, cervical radiculopathy, loss of appetite.  Had tried Wellbutrin but only given 1 or 2 pill and stop because it did not work.  Patient is resistant to get counseling as he  tried in the past but did not like the therapist when he was told to do writing journal about his symptoms.  Patient is very well aware about his symptoms and he had done a lot of research about his illness with long-term prognosis.  He  thinks about his long-term life expectancy but also like to enroll in a trial if started at [facility] and [place].  Recently neurology started him on mirtazapine 7.5 mg.  Though he is resistant to take the medication but after some discussion he promised to consider taking it.  I discussed in detail about optimizing the dose and benefits and side effects of the medication.  His biggest concern is not doing very well in the school.  I did talk about taking time off from the school and patient has contact the disability department of the school about the process of skipping the semester.  He is not sure about the future if he will continue the program but like to have online classes for artificial intelligence and coding.  He feels getting upset, irritable and taking impulsive decision was not good for himself in the family and realized he need to work on his anger issues and depression.  He also agree that in the future if he had any suicidal thoughts then he will call us back or seek help.  I offered if he need the latter from our office to help his decision to drop out from the school then he should let us know.  Patient will contact if he need a letter from our office.  He promised to take the mirtazapine for dose and may need to increase to 15 mg after a week if no side effects.  Patient will call us for future appointment if he wished to continue treatment in our office.  I recommend to call us back if is any question, concern or if he feel worsening of the symptoms.  No follow-up appointment scheduled at this time.  Collaboration of Care: Other provider involved in patient's care AEB notes are available in epic to review.  Patient/Guardian was advised Release of Information  must be obtained prior to any record release in order to collaborate their care with an outside provider. Patient/Guardian was advised if they have not already done so to contact the registration department to sign all necessary forms in order for [place] to release information regarding their care.   Consent: Patient/Guardian gives verbal consent for treatment and assignment of benefits for services provided during this visit. Patient/Guardian expressed understanding and agreed to proceed.   Kathlee Nations, MD 4/1/20244:48 PM

## 2022-09-25 ENCOUNTER — Other Ambulatory Visit (HOSPITAL_COMMUNITY): Payer: Self-pay | Admitting: Psychiatry

## 2022-09-25 ENCOUNTER — Other Ambulatory Visit (HOSPITAL_COMMUNITY): Payer: Self-pay

## 2022-09-25 MED ORDER — MIRTAZAPINE 15 MG PO TABS
15.0000 mg | ORAL_TABLET | Freq: Every day | ORAL | 0 refills | Status: DC
  Filled 2022-09-25: qty 90, 90d supply, fill #0

## 2022-12-30 DIAGNOSIS — R0602 Shortness of breath: Secondary | ICD-10-CM | POA: Diagnosis not present

## 2022-12-30 DIAGNOSIS — R001 Bradycardia, unspecified: Secondary | ICD-10-CM | POA: Diagnosis not present

## 2022-12-30 DIAGNOSIS — R5383 Other fatigue: Secondary | ICD-10-CM | POA: Diagnosis not present

## 2022-12-30 DIAGNOSIS — G7111 Myotonic muscular dystrophy: Secondary | ICD-10-CM | POA: Diagnosis not present

## 2022-12-30 DIAGNOSIS — Z1331 Encounter for screening for depression: Secondary | ICD-10-CM | POA: Diagnosis not present

## 2023-01-07 ENCOUNTER — Other Ambulatory Visit: Payer: Self-pay

## 2023-01-07 ENCOUNTER — Other Ambulatory Visit (HOSPITAL_COMMUNITY): Payer: Self-pay | Admitting: Psychiatry

## 2023-01-07 ENCOUNTER — Other Ambulatory Visit (HOSPITAL_COMMUNITY): Payer: Self-pay

## 2023-01-07 DIAGNOSIS — G7111 Myotonic muscular dystrophy: Secondary | ICD-10-CM | POA: Diagnosis not present

## 2023-01-07 MED ORDER — MEXILETINE HCL 250 MG PO CAPS
250.0000 mg | ORAL_CAPSULE | Freq: Two times a day (BID) | ORAL | 5 refills | Status: AC
  Filled 2023-01-07: qty 60, 30d supply, fill #0
  Filled 2023-02-10: qty 180, 90d supply, fill #1
  Filled 2023-05-26: qty 120, 60d supply, fill #2

## 2023-01-07 MED ORDER — CYPROHEPTADINE HCL 4 MG PO TABS
2.0000 mg | ORAL_TABLET | Freq: Three times a day (TID) | ORAL | 5 refills | Status: AC | PRN
  Filled 2023-01-07: qty 30, 20d supply, fill #0

## 2023-01-09 ENCOUNTER — Other Ambulatory Visit (HOSPITAL_COMMUNITY): Payer: Self-pay

## 2023-01-13 DIAGNOSIS — G7111 Myotonic muscular dystrophy: Secondary | ICD-10-CM

## 2023-01-14 ENCOUNTER — Telehealth (HOSPITAL_BASED_OUTPATIENT_CLINIC_OR_DEPARTMENT_OTHER): Payer: Self-pay

## 2023-01-21 ENCOUNTER — Other Ambulatory Visit (HOSPITAL_COMMUNITY): Payer: Self-pay

## 2023-01-21 DIAGNOSIS — H0102B Squamous blepharitis left eye, upper and lower eyelids: Secondary | ICD-10-CM | POA: Diagnosis not present

## 2023-01-21 DIAGNOSIS — H0102A Squamous blepharitis right eye, upper and lower eyelids: Secondary | ICD-10-CM | POA: Diagnosis not present

## 2023-01-21 DIAGNOSIS — H1045 Other chronic allergic conjunctivitis: Secondary | ICD-10-CM | POA: Diagnosis not present

## 2023-01-21 MED ORDER — TOBRAMYCIN-DEXAMETHASONE 0.3-0.1 % OP SUSP
1.0000 [drp] | Freq: Three times a day (TID) | OPHTHALMIC | 0 refills | Status: AC
  Filled 2023-01-21: qty 5, 7d supply, fill #0

## 2023-01-27 ENCOUNTER — Telehealth (INDEPENDENT_AMBULATORY_CARE_PROVIDER_SITE_OTHER): Payer: Self-pay

## 2023-01-27 NOTE — Telephone Encounter (Signed)
Patient is being referred to Neurology  for    Myotonic muscular dystrophy     Referring Provider:  Jack Quarto, MD       Patients PCP (update PCP on chart if needed) : Summer, Gigi Gin, MD    Please review and advise on how to proceed with patient    Internal or External Referral: External         If external referral- were referral & notes scanned YES    Is referral STAT? NO    Authorization expiration date:    01/13/2024     Best Call Back Number: [phone]     Best Call Back Time: Anytime    Is it ok to leave a message? Yes     Was pt informed of turnaround for review? YES

## 2023-01-28 NOTE — Telephone Encounter (Signed)
LVM- pt to call back and sch new appt w/Neuromuscular MD.

## 2023-01-30 ENCOUNTER — Other Ambulatory Visit (HOSPITAL_COMMUNITY): Payer: Self-pay

## 2023-02-02 ENCOUNTER — Other Ambulatory Visit (HOSPITAL_COMMUNITY): Payer: Self-pay

## 2023-02-05 ENCOUNTER — Encounter (INDEPENDENT_AMBULATORY_CARE_PROVIDER_SITE_OTHER): Payer: Self-pay | Admitting: Hospital

## 2023-02-10 ENCOUNTER — Other Ambulatory Visit (HOSPITAL_COMMUNITY): Payer: Self-pay

## 2023-02-10 ENCOUNTER — Other Ambulatory Visit (HOSPITAL_COMMUNITY): Payer: Self-pay | Admitting: Psychiatry

## 2023-02-10 DIAGNOSIS — F0631 Mood disorder due to known physiological condition with depressive features: Secondary | ICD-10-CM

## 2023-02-10 MED ORDER — MIRTAZAPINE 15 MG PO TABS
15.0000 mg | ORAL_TABLET | Freq: Every day | ORAL | 0 refills | Status: AC
  Filled 2023-02-10: qty 90, 90d supply, fill #0

## 2023-02-10 NOTE — Progress Notes (Signed)
Patient like to restart Remeron and need refill. Will send refill to [facility] . Ask to schedule appointment for future refills.

## 2023-02-11 ENCOUNTER — Other Ambulatory Visit: Payer: Self-pay

## 2023-03-05 ENCOUNTER — Ambulatory Visit (INDEPENDENT_AMBULATORY_CARE_PROVIDER_SITE_OTHER): Payer: Commercial Managed Care - POS

## 2023-03-05 ENCOUNTER — Encounter (INDEPENDENT_AMBULATORY_CARE_PROVIDER_SITE_OTHER): Payer: Self-pay

## 2023-03-05 VITALS — BP 98/57 | HR 85 | Temp 97.7°F | Resp 20 | Ht 65.0 in | Wt 119.0 lb

## 2023-03-05 DIAGNOSIS — G7111 Myotonic muscular dystrophy: Secondary | ICD-10-CM

## 2023-03-05 MED ORDER — MEXILETINE HCL 250 MG OR CAPS
250.00 mg | ORAL_CAPSULE | ORAL | Status: AC
Start: 2023-01-07 — End: ?

## 2023-03-05 NOTE — Patient Instructions (Signed)
1) referral to ENT, speech,nutrition   2) Lung testing (spirometry)  3) let me know if any new symptoms occur  4) will discuss clinical trials

## 2023-03-06 ENCOUNTER — Telehealth (HOSPITAL_BASED_OUTPATIENT_CLINIC_OR_DEPARTMENT_OTHER): Payer: Self-pay

## 2023-03-06 NOTE — Telephone Encounter (Signed)
Patient is being referred to Therapy Services for a VSS     Dx myotonic dystrophy type I     Please review and advise on how to proceed with patient, Ok with [facility]? Pt prefers [facility]. Thank you.

## 2023-03-26 ENCOUNTER — Ambulatory Visit (INDEPENDENT_AMBULATORY_CARE_PROVIDER_SITE_OTHER): Payer: Commercial Managed Care - POS | Admitting: Head and Neck Surgery

## 2023-03-26 ENCOUNTER — Encounter (INDEPENDENT_AMBULATORY_CARE_PROVIDER_SITE_OTHER): Payer: Self-pay | Admitting: Hospital

## 2023-03-26 ENCOUNTER — Ambulatory Visit (INDEPENDENT_AMBULATORY_CARE_PROVIDER_SITE_OTHER): Payer: Commercial Managed Care - POS | Admitting: Speech Therapy

## 2023-03-26 DIAGNOSIS — G7111 Myotonic muscular dystrophy: Secondary | ICD-10-CM

## 2023-03-26 DIAGNOSIS — R1310 Dysphagia, unspecified: Secondary | ICD-10-CM

## 2023-03-26 DIAGNOSIS — R198 Other specified symptoms and signs involving the digestive system and abdomen: Secondary | ICD-10-CM

## 2023-03-26 DIAGNOSIS — R490 Dysphonia: Secondary | ICD-10-CM

## 2023-03-26 DIAGNOSIS — R1312 Dysphagia, oropharyngeal phase: Secondary | ICD-10-CM

## 2023-03-26 NOTE — Progress Notes (Signed)
Attending Attestation:    I personally interviewed and examined the patient on 03/26/2023 , and I have reviewed the note by Dr. Remigio Eisenmenger from 03/26/2023 .    I agree w/ the resident history, exam, assessment, and plan with the following additions:     Additional attending documentation:     History notable for MD. Gets hypersensitivity gag with meals. Unable to take more than 1 meal per day.  Not losing weight.  Feels that food goes right direction.  Notes efforftul swallow.  Has tried neurontin with benefit in the past.  However, recently, it has not been working.  Also notes voice cracks with use and is pitchy with higher pitch noted. Exam today shows normal stroboscopy. FEES with penetration with larger volume. Will plan for SLP intervention for attempts at behavioral desensitization and voice therapy for puberphonia. Also discussed uvulectomy and other medication options.  Will also plan for MBS to evaluate UES and esophageal function.

## 2023-03-26 NOTE — Interdisciplinary (Addendum)
[facility]     Department of Otolaryngology  Center for Airway, Voice, and Swallowing    Speech-Language Pathology Consult  Voice, Swallowing Evaluation    CHIEF COMPLAINT: dysphagia, dysphonia    REFERRING PROVIDER:  Ala Park    Collaborative visit with laryngologist.    HPI  Elijah Park is a 22 year old male presenting for evaluation at [facility] for Voice and Swallowing. Elijah Park was seen alone.    Elijah Park reports hyperactive gag reflex associated with PO intake. It feels like it comes from his uvula/throat. It worsens when he eats more meals per day; currently only tolerating 1 meal per day. Dry, dense, stringy textures cause the most issues: noodles, burritos, steak. Swallowing is effortful. He moistens dry solids. Weight is stable. Denies pen/asp sensation. A few years ago with a similar issue he was rx'd gabapentin by an ENT, and this did feel helpful. He has not tried topical numbing during swallowing. Type 1 myotonic dystrophy.    He is also bothered by dysphonia characterized by voice cracks. Strangers comment to him that his voice sounds hoarse >90% of the time.    Pain  +0/10    Social  No family history on file.  No family status information on file.       Medications  Current Outpatient Medications on File Prior to Visit   Medication Sig Dispense Refill    mexiletine (MEXITIL) 250 MG capsule Take 1 capsule (250 mg) by mouth.      Multiple Vitamin (MULTIVITAMIN ADULT PO) 1 tablet by Oral route daily.       No current facility-administered medications on file prior to visit.       Medical History  No past medical history on file.  There is no problem list on file for this patient.      Patient Questionnaire      03/20/2023     5:02 AM   Vocal Health   Describe your vocal concerns Hoarse voice, cracks a lot, people ask me whats wrong with your voice, or to drink wayer   Seen an ENT or speech pathologist previously? If yes, what was the outcome? Not recently last saw one  in 2020   Water? 32 oz   Coffee? 0 oz   Tea? 0 oz   Soda? 16 oz   Juice? 0 oz   Alcohol? 0 oz   Other? 0 oz         03/20/2023     5:02 AM   Swallowing   If yes, please explain. Uvula either causes gagging or food is too dry or hard to swallow (like can no longer eat burritos)   Any recent bronchitis or pneumonia? N/A       Voice Handicap Index Total Score: 26  Singing Voice Handicap Index Total Score: 31  Voice Related Quality of Life Index Total Score: 24  Dyspnea Index Total Score: 8  Cough Severity Index Total Score: 29  Reflux Symptom Index Total Score: 23  Eating Tool Assessment Total Score: 33      Vocal Demands: Moderate  Daily hydration:        03/20/2023     5:02 AM   VSC - Fluid Intake   Water? 32 oz   Coffee? 0 oz   Tea? 0 oz   Soda? 16 oz   Juice? 0 oz   Alcohol? 0 oz   Other? 0 oz  Oral-Motor Examination  Facial Muscles: Intact bilaterally  Dentition: Within normal limits  Buccal cavity: Normal  Mucosal Quality : Good  Lips: Normal  Tongue movement: Normal protrusion  Jaw/lingual Differentiation: WNL  Hard palate : Normal  Velar Movement with /a/: Elevates symmetrically  Gag Reflex: Intact bilaterally  Vocal Quality : Good    Behavioral Voice Evaluation (CPT 92524)  Auditory Perceptual Evaluation  GRBAS:   Grade:  1   Roughness:  0   Breathiness:  1   Asthenia:  0   Strain:  0   Total:  2   Perceptual Observations:  Pitch Inflection:  Frequent pitch breaks   Speech Rate:  Normal   Breath Support:  Diaphragmatic   Voice Quality and Characteristics at the conversational level:  Voice breaks into falsetto intermittently, breathy and high pitch. No patterns of specific sounds.    Laryngeal Function Studies with Interpretation  Insufficient time due to late patient arrival. This should be completed next visit.    Endoscopy with Videostroboscopy  Completed with Drs. Podury, [facility]. Normal exam. Pitch breaks appear functional in nature.    Swallowing Evaluation: (CPT 92610)  Dysphagia Symptoms :  Coughing/choking on solids;Hold up of food in throat;Pharyngeal symptoms;Globus sensation;Increased deglutition time;Other( comments) (hyperactive gag reflex with PO intake)  Current diet: Thin liquids;PO regular solids  Oral Secretions: Normal  Respirations: Normal  Food/liquid administered during evaluation: Thin liquid by cup;Pureed food by spoon;Mechanical soft solid;Solid food  Lip seal: Within normal limits  Oral Prep : Intact  Mastication: Intact  Swallow Initiation: Intact  Hyolaryngeal elevation: Intact  Oral stasis: No  Swallow efficiency: No overt symptoms of laryngeal penetration/aspiration;Within normal limits;Clear voice noted across trials  Duration of deglutition: Normal;Increased  Problems: Signs/symptoms of laryngeal penetration;Signs/symptoms of laryngeal aspiration;Sensation of pharyngeal stasis;Globus sensation;Other (comments) (hyperactive gag reflex/nausea)  Clinical Exam Recommendations: FEES;Initiate Swallow therapy    Interpretation of Flexible Endoscopic Evaluation of Swallowing (FEES): (CPT [id number])  PO trials included thin, puree, mech soft, solid. Mild vallecular residue. Mild pooling of secretions, cleared with sip of liquid. Mild pyriform residue. Trace penetration above the cords, no aspiration (PAS 3). Mildly impaired efficiency and safety, otherwise normal exam. Good sensation.        Swallow Diagnostic Therapy: (CPT [id number])    Education Reviewed FEES with patient. Provided education re: the anatomy/physiology of the swallow, clinical s/s of aspiration, and dysphagia. Exam notable for pooled secretions which cleared with sip of liquid; query infrequent swallowing vs UES issues as source of problem. Couple instances of penetration with large volume thin liquid sips, particularly with head tipped back. VFSS warranted to further explore oropharyngeal swallow   Instructions/Observations Provided safe swallowing strategies and compensatory strategies for PO intake including: Maintain  neutral head position, small sips of liquid at a time. Liquid wash.   Exercise(s) Provided demonstration and instruction for: None given.     Recommended Diet  Liquids - Thin - 0; Solids - Regular - 7 as tolerated    Impressions  Mild oropharyngeal dysphagia, with mildly impaired safety and efficiency. Pooled secretions in hypopharynx cleared easily, but videofluoroscopic swallow study is warranted to further assess UES function. We will proceed with a few visits of swallowing therapy, too, to see if we can decrease hypersensitive gag reflex related to PO intake.    Mild dysphonia characterized by mild breathiness, pitch breaks into falsetto register. 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 Behavioral voice therapy is appropriate with good prognosis for improvement given nature of disorder,  and patient motivation. Next visit we should start with laryngeal function studies because were unable to complete these today due to late patient arrival.    Recommendations  Return for VFSS  Return voice, and swallowing therapy    SLP Treatment Frequency & Duration: 3 visits total, one every ~2 weeks     Goals        Maintain appropriate speaking register (modal register) >80% of the time during 10 minute casual conversation over 8 weeks      Patient will report reduced gag hypersensitivity and improved ability to tolerate at least 2 meals per day, over 8 weeks      Verbalize and implement dysphagia compensatory strategies to ensure safe and efficient swallowing.             Thank you for your very kind referral.       Maree Krabbe, MS, CCC-SLP  Speech-Language Pathologist     Evaluation time: 30  Treatment time: 30

## 2023-03-26 NOTE — Progress Notes (Signed)
[facility]     CHIEF COMPLAINT:  Dysphagia, dysphonia    HISTORY OF PRESENT ILLNESS:  This is a 22 year old male with history of type 1 myotonic dystrophy, prior thoracic spine fracture who presents with dysphagia.    Patient reports hyperactive gag reflex and spasms involving uvula over several months. Has limited PO intake, previously ate 3 meals a day, now only able to tolerate one meal daily. Response is triggered by dry and textured foods like noodles and burritos. Also states swallow is more effortful. Swallow is improved when taking small bites with yogurt. Denies weight loss, dyspnea, coughing or choking with meals. Trialed gabapentin without improvement in symptoms. Has not tried lidocaine or other topicals.    Patient has history of myotonic dystrophy confirmed via genetic testing which showed 200-250 repeats. Primary symptom is reduced grip strength, distal BLE paresthesias. No bulbar symptoms. Follows with Dr. Michaelle Copas in Neurology. Currently on mexiletine.    No past medical history on file.    No past surgical history on file.    Current Outpatient Medications on File Prior to Visit   Medication Sig Dispense Refill    mexiletine (MEXITIL) 250 MG capsule Take 1 capsule (250 mg) by mouth.      Multiple Vitamin (MULTIVITAMIN ADULT PO) 1 tablet by Oral route daily.       No current facility-administered medications on file prior to visit.       No Known Allergies    Social History     Socioeconomic History    Marital status: Single     Spouse name: Not on file    Number of children: Not on file    Years of education: Not on file    Highest education level: Not on file   Occupational History    Not on file   Tobacco Use    Smoking status: Never    Smokeless tobacco: Never   Substance and Sexual Activity    Alcohol use: Not on file    Drug use: Not on file    Sexual activity: Not on file   Other Topics Concern    Not on file   Social History Narrative    Not on  file     Social Determinants of Health     Financial Resource Strain: Patient Declined (12/30/2022)    Received from [facility], [facility]    Overall Financial Resource Strain (CARDIA)     Difficulty of Paying Living Expenses: Patient declined   Food Insecurity: Patient Declined (12/30/2022)    Received from [facility], [facility]    Hunger Vital Sign     Worried About Running Out of Food in the Last Year: Patient declined     Ran Out of Food in the Last Year: Patient declined   Transportation Needs: Patient Declined (12/30/2022)    Received from [facility], [facility]    PRAPARE - Transportation     Lack of Transportation (Medical): Patient declined     Lack of Transportation (Non-Medical): Patient declined   Physical Activity: Not on file   Stress: Not on file   Social Connections: Unknown (10/22/2021)    Received from [facility], [facility]    Social Network     Social Network: Not on file   Intimate Partner Violence: Unknown (09/13/2021)    Received from [facility], [facility]    Intimate Partner Violence  Fear of Current or Ex-Partner: Not asked     Emotionally Abused: Not asked     Physically Abused: Not asked     Sexually Abused: Not asked   Housing Stability: Patient Declined (12/30/2022)    Received from [facility], [facility]    Housing Stability Vital Sign     Unable to Pay for Housing in the Last Year: Patient declined     Number of Places Lived in the Last Year: 0     In the last 12 months, was there a time when you did not have a steady place to sleep or slept in a shelter (including now)?: Patient declined       ROS:    A 10 system review of systems was negative except as noted above.      03/20/2023     5:03 AM   VSC - ROS   Neurological Numbness    Weakness   Gastrointestinal Heartburn    Regurgitation    Stomach pain   Psychiatric Depression   Nose/Sinus Post-nasal drip   Ears Hearing loss   Musculoskeletal Muscle pain   Endocrine Change in thirst    Change in  appetite   Hematological Fatigue   Constitutional Weight loss    Fatigue   Genitourinary Urinary incontinence       PHYSICAL EXAMINATION  There were no vitals taken for this visit.  VOICE:  hoarse, breathy  GENERAL:  No apparent distress.  Normal affect.  EARS:  Right:  External auditory canal patent.  Tympanic membrane intact.  Middle ear aerated.  Left:  External auditory canal patent.  Tympanic membrane intact.  Middle ear aerated.  NOSE: clear anteriorly.  Nasal mucosa healthy.  Septum midline.  ORAL CAVITY: no masses or ulcerations.  OROPHARYNX: no masses or ulcerations.  Palate elevates midline.  NECK: No masses.  No cervical adenopathy.  No thyroid masses.  NEURO:  CN's V, VII, and XII intact and symmetrical.    PROCEDURE:  Flexible Endoscopic Evaluation of Swallowing (FEES): (CPT [phone])  Verbal consent obtained, topical anesthetization performed  Velopharyngeal closure: Normal   Tongue base, epiglottis, arytenoids and post-cricoid:    Vocal fold mobility and appearance: Normal   Murray Secretion Scale: 1   Base of tongue retraction: Reduced   Pharyngeal constriction: Normal   PO administered: Thin (water)  Nectar (juice)  Puree (applesauce)  Mechanical soft/mixed texture (fruit cup)  Solid (cracker)  Pill simulation (Smartie candy)   Oral containment/control of bolus: Able masticate, form, and transfer.   Swallow timing: Normal   Yale Pharyngeal Residue Severity Rating Scale: Vallecular score = 3  Pyriform score = 3   Sensation / Response to residue: Sensate   Airway protection: Good   Penetration: Trace to all textures   Aspiration: None   Compensatory strategies: Breathing strategies for gag reflex   Penetration-Aspiration Scale: 3     Murray Secretion Scale (MSS):    0=Normal moist  1=Pooling outside laryngeal vestibule  2=Pooling in laryngeal vestibule transiently, spills in over observation period or patient clears them  3=Pooling in laryngeal vestibule consistently and patient does not clear  them    Yale Pharyngeal Residue Severity Rating Scale   I-None   II-Trace   III-Mild   IV-Moderate   V-Severe    PAS Scoring  1 - Does not enter airway  2 - Enters airway, remains above vocal folds, no residue  3 - Remains above vocal folds, visible residual remains  4 -  Contacts vocal folds, no residue remains  5 - Contacts vocal folds, visible residue remains  6 - Passes glottis, no subglottic residue visible  7 - Passes glottis, visible subglottic residual despite sensory response  8 - Passes glottis, visible subglottic residual, absent sensory response    Videostroboscopy:    Flexible laryngoscopy with videostroboscopy was performed.  After anesthetization and verbal consent, an endoscope was inserted through the left nasal pasage.  The nasopharynx, base of tongue, pyriform sinus, and post-cricoid region were free of masses, lesions, and ulcerations.  The vocal folds adduct and abduct normally and symmetrically.  There is a patent glottic airway.     Stroboscopic examination reveals a normal vibratory amplitude of the true vocal folds.  There is normal propagation of the mucosal wave.  There are no submucosal lesions noted.  There is complete glottic closure during sustained phonatory tasks.    ASSESSMENT &  PLAN:  (1). Myotonic dystrophy  (2). Hypersensitive gagging  (3). Dysphonia/puberphonia  This is a 22 year old male with history of type 1 myotonic dystrophy, prior thoracic spine fracture who presents with dysphagia and dysphonia. Reports hyperactive gag reflex and spasms involving uvula over several months with resultant decrease in PO intake. Also feels voice is hoarse. Strobe unremarkable. FEES with penetration to large volumes. Recommend VFSS for further assessment and SLP evaluation for dysphagia and dysphonia.      - Obtain VFSS  - Referral to SLP    Thank you for the referral of this patient to the [facility] [place].  Should you have any questions about the care of this patient or  any other, please call us at [phone].    Kern Reap, MD  Head & Neck Surgery, PGY-2  [facility]  [facility] Pagers: [phone], [facility] [phone]

## 2023-03-29 ENCOUNTER — Encounter (INDEPENDENT_AMBULATORY_CARE_PROVIDER_SITE_OTHER): Payer: Self-pay

## 2023-03-30 NOTE — Telephone Encounter (Signed)
Please review and advise if any further action is needed.   The patient's message has been forwarded to the appropriate Provider/Pool, and the patient has been notified via MyChart.   Ensure that any actions taken are documented under the 'DOCUMENTATION' tab.   If additional steps are required, kindly route the encounter back to the appropriate team 'POOL ONLY'.   If no further action is necessary, please mark the message as 'DONE'.     Thank you!    French Ana  Signature Derived from [employer], March 30, 2023, 10:07 AM.

## 2023-03-31 ENCOUNTER — Emergency Department
Admission: EM | Admit: 2023-03-31 | Discharge: 2023-04-01 | Disposition: A | Discharge status: Home / Self Care | Payer: Commercial Managed Care - POS

## 2023-03-31 ENCOUNTER — Emergency Department (INDEPENDENT_AMBULATORY_CARE_PROVIDER_SITE_OTHER): Payer: Commercial Managed Care - POS | Admitting: Emergency Medicine

## 2023-03-31 VITALS — BP 118/84 | HR 53 | Temp 97.8°F | Resp 19 | Ht 68.0 in | Wt 110.0 lb

## 2023-03-31 DIAGNOSIS — R319 Hematuria, unspecified: Secondary | ICD-10-CM | POA: Diagnosis not present

## 2023-03-31 DIAGNOSIS — R1031 Right lower quadrant pain: Secondary | ICD-10-CM | POA: Diagnosis not present

## 2023-03-31 DIAGNOSIS — K6389 Other specified diseases of intestine: Secondary | ICD-10-CM | POA: Diagnosis not present

## 2023-03-31 DIAGNOSIS — Z0000 Encounter for general adult medical examination without abnormal findings: Secondary | ICD-10-CM

## 2023-03-31 DIAGNOSIS — N390 Urinary tract infection, site not specified: Secondary | ICD-10-CM | POA: Insufficient documentation

## 2023-03-31 DIAGNOSIS — N3289 Other specified disorders of bladder: Secondary | ICD-10-CM | POA: Diagnosis not present

## 2023-03-31 LAB — UA, CHEM ONLY POCT
Bilirubin: NEGATIVE
Glucose: NEGATIVE
Nitrite: NEGATIVE
Specific Gravity: >=1.03 (ref 1.001–1.035)
Urobilinogen: 1 (ref 0.2–1.0)
pH: 7 (ref 5.0–8.0)

## 2023-03-31 LAB — URINALYSIS WITH CULTURE REFLEX, WHEN INDICATED
Bilirubin: NEGATIVE
Glucose: NEGATIVE
Ketones: NEGATIVE
Leuk Esterase: 250 Leu/uL — AB
Nitrite: NEGATIVE
RBC: >50 — AB (ref 0–?)
Specific Gravity: 1.029 (ref 1.002–1.030)
Urobilinogen: NEGATIVE
WBC: >50 — AB (ref 0–?)
pH: 6.5 (ref 5.0–8.0)

## 2023-03-31 NOTE — Interdisciplinary (Signed)
Pt had a verbal DC with provider, upon recheck of chart pt had blood work ordered.   Provider informed, OK to return tomorrow to have labs collected.     Pt called agreeable to return tomorrow for labs

## 2023-03-31 NOTE — ED Notes (Signed)
Bed: D1  Expected date:   Expected time:   Means of arrival: Automobile  Comments:

## 2023-03-31 NOTE — Progress Notes (Signed)
Subjective :  Elijah Park is a 22 year old male who is here for Hematuria (Pt Co 1 episode of bloody urine @ 1800 today. Denies flank pain )      Apart from the hematuria is otherwise asymptomatic.  Specifically denied flank pain, pain with urination, dysuria, fevers, nausea, vomiting,   Or edema.        Issues discussed today:    Hematuria    Review of Systems      mexiletine Take 1 capsule (250 mg) by mouth.      Multiple Vitamin (MULTIVITAMIN ADULT PO) 1 tablet by Oral route daily.       No Known Allergies    Reviewed patients pertinent information related to social history, past medical, past surgical, and family history.     Objective :  Vital signs: BP 118/84   Pulse 53   Temp 97.8 F (36.6 C)   Resp 19   Ht 5\' 8"  (1.727 m)   Wt 49.9 kg (110 lb)   SpO2 98%   BMI 16.73 kg/m     Physical Exam  Constitutional:       Appearance: Normal appearance.   Pulmonary:      Effort: Pulmonary effort is normal.      Comments: Speaking clearly and in full sentences  Abdominal:      General: Abdomen is flat.      Palpations: Abdomen is soft.      Tenderness: There is no abdominal tenderness. There is no guarding or rebound.   Genitourinary:     Comments:  Offered and declined  Skin:     Capillary Refill: Capillary refill takes less than 2 seconds.   Neurological:      Mental Status: He is alert and oriented to person, place, and time. Mental status is at baseline.          Assessment/Plan:   Who presented with 1 episode of painless hematuria.  Considered renal stone although unlikely without other symptoms.  Also considered UTI pyelo being male this is less likely as well.  UA was performed and did confirm blood and protein.  At this time has unclear etiology.  Encouraged to follow up in 2 weeks with referred PCP or return to urgent care for a repeat UA.  Encouraged  to present to ED if he is unable to void has fevers, nausea, vomiting,  new onset of or any other concerns.    Assessment & Plan  Hematuria,  unspecified type    Orders:    UA, Chem Only (POCT)    CBC w/ Diff Lavender; Future    Comprehensive Metabolic Panel (CMP); Future    Urine Culture - See Instructions; Future    Consult/Referral to Primary Care    Urine Culture - See Instructions

## 2023-03-31 NOTE — ED Provider Notes (Signed)
ED Provider Note   electronic medical record reviewed for pertinent medical history.     Elijah Park DOB: 26-Aug-2000 PMD: No Pcp, Per Patient     Chief Complaint   Patient presents with    Hematuria     Pt presents with hematuria since this morning was seen at urgent care and referred here. Pt states he feels he is unable to fully empty his bladder. No prior hx     HPI: Elijah Park is a 22 year old male with one episode of painless hematuria. Was seen at urgent care with UA notable for blood. Was told to follow up with PCP and return to the ED for worsening symptoms.  He reports that he began to develop right lower quadrant abdominal pain this evening.  States that he was had two episodes of hematuria where he just saw "straight blood."  He denies any chest pain, shortness of breath, fevers.  He is not taking any medications.  Denies any family history of bladder cancer or other abnormality.  He denies any testicular bleeding, swelling, discharge, concerns for STDs.  Denies any sexual activity.           External Data Sources (Select all that apply):  03/31/23 from (institution & date) Urgent Care. Information obtained: Past medical history.    Pertinent Medical History:    PMHx: As above    No past surgical history on file.    No family history on file.    Current Outpatient Medications   Medication Instructions    ciprofloxacin (CIPRO) 500 mg, Oral, 2 TIMES DAILY    mexiletine (MEXITIL) 250 mg, Oral    Multiple Vitamin (MULTIVITAMIN ADULT PO) 1 tablet, Oral, DAILY     Physical Exam  BP 101/64   Pulse 55   Temp 98.4 F (36.9 C)   Resp 17   Ht 5\' 8"  (1.727 m)   Wt 58 kg (127 lb 13.9 oz)   SpO2 98%   BMI 19.44 kg/m   Physical Exam  Nursing note and vitals reviewed. Pt not hypoxic.  Gen: A&Ox4, patient is in NAD  HEENT: NC/AT, PERRL  Neck: Supple, trachea midline with normal ROM  Cardiovascular: RRR no m/r/g  Pulmonary/Chest: CTAB, no increased WOB.  Back: No CVAT   GI: Soft. TTP of RLQ, no  rebound   MSK: no edema, no tenderness, FROM  Neuro: normal speech, sensation intact, good coordination, ambulates steady gait  Skin: Warm, dry    Orders/Medications    Orders Placed This Encounter   Procedures    CT Abdomen And Pelvis With Contrast    Urinalysis with Culture Reflex, when indicated    CMP    CBC w/ Diff Lavender     Medications   ciprofloxacin (CIPRO) tablet 500 mg (has no administration in time range)   iohexol (OMNIPAQUE 350) 350 MG/ML solution 500 mL (100 mL IntraVENOUS Given 04/01/23 0115)         Medical Decision Making/Assessment/Plan  This is a(n) 22 year old male   Presenting to the emergency department with 1 day of hematuria with subsequent right lower quadrant abdominal pain.  Vitals are otherwise reassuring and he is in no acute distress.  He does have some tenderness to palpation however of the right lower quadrant.  He has never had hematuria in the past.  He denies any concerns for sexual activity.  Differential broad at this time including UTI, bladder mass, appendicitis, kidney stone.  Due to his  tenderness as well, will obtain CT abdomen and pelvis for further  evaluation as well as blood work.  Will obtain PVR and urinalysis.  Dispo pending further workup.        ED Course/Updates/Disposition  ED Course as of 04/01/23 0323   Drue Novel Bora's Documentation   Wed Apr 01, 2023   0008 Blood(!): 3+   0008 Leuk Esterase(!): 250   0008 WBC(!): >50   0114 Creatinine: 1.16   0321 IMPRESSION:  IMPRESSION:   1.   Mild wall thickening of the urinary bladder. Mild cystitis not excluded.   2.   Mild wall thickening of the distal sigmoid colon/rectum. Colitis not   excluded      0321   CT with findings of mild wall thickening, will treat as acute UTI.  He denies any sexual activity or history of STDs in his not concerned for treatment.  Potential colitis also seen but patient has no diarrhea.  Will provide antibiotics for UTI and plan for discharge with PCP follow up      Others'  Documentation   Wed Apr 01, 2023   0043 I have seen and evaluated this patient in combination with the resident physician.  Briefly, patient is a 22 year old male with myotonic dystrophy presenting to the ED secondary to hematuria that began today.  Patient states it is painful.  He feels a stinging sensation when he urinates.  Later on today he had developed pain in the right lower quadrant.  Denies back pain.  Denies nausea or vomiting.  Denies fevers or chills.  He presents to the ED nontoxic in appearance, afebrile, and hemodynamically stable.  On exam he was very mild right lower quadrant tenderness to palpation.  There was no CVA tenderness bilaterally.  We will check lab work, urinalysis, and sent for CT scan of his abdomen and pelvis.  We will observe.  Patient was agreeable to this plan of care. [LS]      ED Course User Index  [LS] [name], DO         ED Clinical Impression as of 04/01/23 0323   Hematuria, unspecified type   Right lower quadrant pain   Acute UTI         Data Reviewed:      Limited Bedside Ultrasound Procedure:   All images archived on [facility] servers.    Risk of Complications and/or Morbidity:               Feliz Beam, MD  Resident  04/01/23 0323       [name], DO  04/01/23 [phone]

## 2023-04-01 ENCOUNTER — Emergency Department (HOSPITAL_COMMUNITY): Payer: Commercial Managed Care - POS

## 2023-04-01 DIAGNOSIS — R319 Hematuria, unspecified: Secondary | ICD-10-CM

## 2023-04-01 DIAGNOSIS — N3289 Other specified disorders of bladder: Secondary | ICD-10-CM

## 2023-04-01 DIAGNOSIS — K6389 Other specified diseases of intestine: Secondary | ICD-10-CM

## 2023-04-01 DIAGNOSIS — R1031 Right lower quadrant pain: Secondary | ICD-10-CM

## 2023-04-01 DIAGNOSIS — Z0000 Encounter for general adult medical examination without abnormal findings: Secondary | ICD-10-CM | POA: Diagnosis not present

## 2023-04-01 LAB — CBC WITH DIFF, BLOOD
ANC-Automated: 4.5 10*3/uL (ref 1.6–7.0)
Abs Basophils: 0 10*3/uL (ref <0.2)
Abs Eosinophils: 0.1 10*3/uL (ref 0.0–0.5)
Abs Lymphs: 3.3 10*3/uL — ABNORMAL HIGH (ref 0.8–3.1)
Abs Monos: 0.5 10*3/uL (ref 0.2–0.8)
Basophils: 0.5 %
Eosinophils: 1.3 %
Hct: 43.2 % (ref 40.0–50.0)
Hgb: 14.7 g/dL (ref 13.7–17.5)
Imm Gran %: 0.2 % (ref <1)
Imm Gran Abs: 0 10*3/uL (ref <0.1)
Lymphocytes: 39 %
MCH: 26.4 pg (ref 26.0–32.0)
MCHC: 34 g/dL (ref 32.0–36.0)
MCV: 77.7 um3 — ABNORMAL LOW (ref 79.0–95.0)
MPV: 11.5 fL (ref 9.4–12.4)
Monocytes: 6.3 %
Plt Count: 173 10*3/uL (ref 140–370)
RBC: 5.56 10*6/uL (ref 4.60–6.10)
RDW: 12.7 % (ref 12.0–14.0)
Segs: 52.7 %
WBC: 8.5 10*3/uL (ref 4.0–10.0)

## 2023-04-01 LAB — COMPREHENSIVE METABOLIC PANEL, BLOOD
ALT (SGPT): 9 U/L (ref 0–41)
AST (SGOT): 14 U/L (ref 0–40)
Albumin: 4.2 g/dL (ref 3.5–5.2)
Alkaline Phos: 61 U/L (ref 40–129)
Anion Gap: 10 mmol/L (ref 7–15)
BUN: 17 mg/dL (ref 6–20)
Bicarbonate: 26 mmol/L (ref 22–29)
Bilirubin, Tot: 0.38 mg/dL (ref <1.2)
Calcium: 9.4 mg/dL (ref 8.5–10.6)
Chloride: 107 mmol/L (ref 98–107)
Creatinine: 1.16 mg/dL (ref 0.67–1.17)
Glucose: 86 mg/dL (ref 70–99)
Potassium: 4.1 mmol/L (ref 3.5–5.1)
Sodium: 143 mmol/L (ref 136–145)
Total Protein: 6.6 g/dL (ref 6.0–8.0)
eGFR Based on CKD-EPI 2021 Equation: >60 mL/min/{1.73_m2}

## 2023-04-01 MED ORDER — CIPROFLOXACIN HCL 500 MG OR TABS
500.0000 mg | ORAL_TABLET | Freq: Once | ORAL | Status: AC
Start: 2023-04-01 — End: 2023-04-01
  Administered 2023-04-01: 500 mg via ORAL
  Filled 2023-04-01: qty 1

## 2023-04-01 MED ORDER — CIPROFLOXACIN HCL 500 MG OR TABS
500.0000 mg | ORAL_TABLET | Freq: Two times a day (BID) | ORAL | 0 refills | Status: AC
Start: 2023-04-01 — End: 2023-04-15

## 2023-04-01 MED ORDER — IOHEXOL 350 MG/ML IV SOLN
500.0000 mL | Freq: Once | INTRAVENOUS | Status: AC
Start: 2023-04-01 — End: 2023-04-01
  Administered 2023-04-01: 100 mL via INTRAVENOUS
  Filled 2023-04-01: qty 500

## 2023-04-01 NOTE — Discharge Instructions (Addendum)
You have been evaluated in the Emergency Department today. You were diagnosed for a UTI.     However, you will need to follow-up with your primary care provider in 1-3 days time for repeat evaluation. The follow-up appointment is a very important part of your ongoing medical evaluation and management and should not be skipped.    Continue to take all prescribed medications as instructed.    Return to the Emergency Department for any new or concerning symptoms, severe headache, worsening neck pain, numbness/tingling/weakness in your upper or lower extremities, difficulty walking, worsening chest pain, difficulty breathing, high fever, severe abdominal pain, uncontrollable bleeding or vomiting.

## 2023-04-02 LAB — URINE CULTURE

## 2023-04-03 ENCOUNTER — Ambulatory Visit
Admission: RE | Admit: 2023-04-03 | Discharge: 2023-04-03 | Disposition: A | Discharge status: Home / Self Care | Payer: Commercial Managed Care - POS

## 2023-04-03 DIAGNOSIS — G7111 Myotonic muscular dystrophy: Secondary | ICD-10-CM | POA: Insufficient documentation

## 2023-04-03 LAB — URINE CULTURE: Urine Culture Result: NO GROWTH

## 2023-04-03 NOTE — Procedures (Signed)
No note

## 2023-04-06 ENCOUNTER — Encounter (INDEPENDENT_AMBULATORY_CARE_PROVIDER_SITE_OTHER): Payer: Self-pay

## 2023-04-06 LAB — SPIROMETRY
FEF 25/75 % PREDICTED: 116 L/s
FEF 25/75: 5.12 L/s
FEV1 % PREDICTED: 93 L
FEV1/FVC % PREDICTED: 106 %
FEV1/FVC: 0.93 %
FEV1: 3.69 L
FVC % PREDICTED: 86 L
FVC: 3.98 L
MEP % PREDICTED: 18 cm[H2O]
MEP: 42.2 cm[H2O]
MIP % PREDICTED: 42 cm[H2O]
MIP: -54.29 cm[H2O]
MVV % PREDICTED: 76 L/min
MVV: 110.85 L/min

## 2023-04-11 ENCOUNTER — Encounter (HOSPITAL_COMMUNITY): Payer: Self-pay

## 2023-04-14 ENCOUNTER — Encounter (INDEPENDENT_AMBULATORY_CARE_PROVIDER_SITE_OTHER): Payer: Self-pay

## 2023-04-14 DIAGNOSIS — G7111 Myotonic muscular dystrophy: Secondary | ICD-10-CM

## 2023-04-15 ENCOUNTER — Telehealth (INDEPENDENT_AMBULATORY_CARE_PROVIDER_SITE_OTHER): Payer: Self-pay

## 2023-04-15 ENCOUNTER — Ambulatory Visit
Admission: RE | Admit: 2023-04-15 | Discharge: 2023-04-15 | Disposition: A | Discharge status: Home / Self Care | Payer: Commercial Managed Care - POS | Attending: Radiology | Admitting: Radiology

## 2023-04-15 ENCOUNTER — Ambulatory Visit (HOSPITAL_BASED_OUTPATIENT_CLINIC_OR_DEPARTMENT_OTHER): Payer: Commercial Managed Care - POS | Admitting: Speech Therapy

## 2023-04-15 DIAGNOSIS — R1312 Dysphagia, oropharyngeal phase: Secondary | ICD-10-CM

## 2023-04-15 DIAGNOSIS — R490 Dysphonia: Secondary | ICD-10-CM

## 2023-04-15 DIAGNOSIS — R1310 Dysphagia, unspecified: Secondary | ICD-10-CM

## 2023-04-15 MED ORDER — BARIUM SULFATE 40 % PO PSTE
230.0000 mL | PASTE | Freq: Once | ORAL | Status: AC
Start: 2023-04-15 — End: 2023-04-15
  Administered 2023-04-15: 25 mL via ORAL

## 2023-04-15 MED ORDER — BARIUM SULFATE 40 % OR SUSP
240.0000 mL | Freq: Once | ORAL | Status: AC
Start: 2023-04-15 — End: 2023-04-15
  Administered 2023-04-15: 40 mL via ORAL

## 2023-04-15 MED ORDER — BARIUM SULFATE 40 % PO SUSR
240.0000 mL | Freq: Once | ORAL | Status: AC
Start: 2023-04-15 — End: 2023-04-15
  Administered 2023-04-15: 60 mL via ORAL

## 2023-04-15 MED ORDER — BARIUM SULFATE 700 MG PO TABS
700.0000 mg | ORAL_TABLET | Freq: Once | ORAL | Status: AC
Start: 2023-04-15 — End: 2023-04-15
  Administered 2023-04-15: 700 mg via ORAL

## 2023-04-15 NOTE — Interdisciplinary (Unsigned)
110 Elijah Park    Same  Still gagging    Oropharynx clear; some esophageal residue when this occurred?    Straw made voice worse  Abdominal effort helped  Flow helped    Ask neurology re: exercises    EAT 22  VHI 33  PILL 8  MDADI 2 global, 54    At most Maybe 5 lbs weight loss, still only 1 meal per day

## 2023-04-15 NOTE — Telephone Encounter (Signed)
Called pt to inform appt has been rescheduled (MDOOO) 05/25/2023 at 330 pm, left voicemail

## 2023-04-21 ENCOUNTER — Telehealth (INDEPENDENT_AMBULATORY_CARE_PROVIDER_SITE_OTHER): Payer: Self-pay

## 2023-04-21 DIAGNOSIS — G7111 Myotonic muscular dystrophy: Secondary | ICD-10-CM

## 2023-04-21 NOTE — Telephone Encounter (Signed)
Hi Dr. Michaelle Copas pt has follow up appt in Dec. Please create referral thank you

## 2023-05-11 ENCOUNTER — Ambulatory Visit: Payer: Commercial Managed Care - POS

## 2023-05-11 ENCOUNTER — Ambulatory Visit: Payer: Commercial Managed Care - POS | Admitting: Speech-Language Pathologist

## 2023-05-21 ENCOUNTER — Encounter (INDEPENDENT_AMBULATORY_CARE_PROVIDER_SITE_OTHER): Payer: No Typology Code available for payment source

## 2023-05-24 NOTE — Progress Notes (Signed)
 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     Weakness: reports about the same, , if he stays upright and having liquids after this is better, also had some acid reflux. Still present but not as badm   Myotonia: more frequent myotonia, maybe due to old, not taking his mexiletine because it was not helping   Cardiac symptoms: no changes , referral to cardiology , last visit echo in July 2023 was normal , prolonged HR monitor low 33 HR at lowest, no other cahnges   Respiratory: spirometry showed MEP and MIP low, does have worsening shortness of breath with laying down.     ENT said that vocal cords were ok.   He does feel that he is losing weight, he was only eating one meal a day. Has not been able to get nutrition follow up   Sleep has been about the same, sleeping more then he should , going to bed around 12-1 , sleeping in evenings      Swallow study   Patient presents with minimal oropharyngeal dysphagia. Intact safety. Mild efficiency impairment due to incomplete epiglottic inversion with liquids, and subtle pharyngeal weakness, resulting in vallecular, and posterior hypopharyngeal wall residue, which cleared with liquid wash. When gagging sensation occurred, oropharynx was clear. AP screen was notable for mild stasis at level of clavicles; I question if this is triggers his gagging sensation. AP screen also notable for slow clearance through distal esophagus.       Initial history:  He had onset of symptoms with grip weakness in December 2022, diagnosed in 2023. No family history. After he was  diagnosed, his father had a low repeat, he is in his 66s, now starting to have symptoms.     Myotonic Dystrophy ROS:  Weakness: grip strength is predominantly affected , had trouble overusing muscles, facial muscles -does have some trouble with chewing, swallowing, has to avoid dry food- avoids bread/steak, has not had a swallow study or speech therapy , has trouble with a "hypergag" reflex, had this in 2020 and tried gabapentin for this and it helped but no longer happening. He is only able to eat one meal a day, started using boost, he has not lost a lot of weight , he is having some foot weakness, no falls but fatigue with walking   Cardiac symptoms: June 2024, HR 42, sinus bradycardia , denies palpitations, chest pain, some chest muscle spasms  Respiratory symptoms: he denies shortness of breath, orthopnea   Myotonia: does w/ hand, jaw locks up in the morning and has slurred speech . He takes mexiletine 250 mg in AM and in PM, does get some acid reflux with this , tried nexium/tums   Sleep/fatigue: his sleep is fragmented, will sleep excessively, modafinal made him stay awake but would sleep for 12 hours , he has fallen asleep while driving   Cognitive: Denies memory changes, feels like has had  poor memory issues  Infertility/Diabetes:  GI symptoms: He does get constipation, not taking anything for this, does not drink a lot of water , food gets stuck in chest   Vision/cataracts: no current vision changes, astigmatism, wears contacts   Hearing impairment: he does have bad hearing       He is majoring in Engineer, manufacturing systems, 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 His family is in [place] , sister lives in [place] area       Plan per Dr. Darleene Ege   - Referral to [facility] clinic, OT  through [facility]  - Annual eye exam and cardiology exam  - Trial of low dose Periactin for appetite, discussed that he can take nightly initially to try and regulate his  sleep cycle a bit as well  - Pending clinical progress will consider adding daytime Ritalin for excessive daytime somnolence  -Sleep clinic referral to discuss additional options for his nighttime insomnia and excessive daytime  somnolence  - Mexiletine 250 mg BID  - Follow up TSH, A 1 C, and lipid profile yearly with PCP  -Interested in clinical trials/ASO trials, may be a candidate if he takes time off school, recommended  AVIDITY study as a possibility  -[facility] MDA clinic in January 2025    Reviewed past medical history:      Reviewed social history:  Was using THC for sleep and myotonia but stopped this       Social History     Socioeconomic History    Marital status: Single     Spouse name: Not on file    Number of children: Not on file    Years of education: Not on file    Highest education level: Not on file   Occupational History    Not on file   Tobacco Use    Smoking status: Never    Smokeless tobacco: Never   Substance and Sexual Activity    Alcohol use: Not on file    Drug use: Not on file    Sexual activity: Not on file   Other Topics Concern    Not on file   Social History Narrative    Not on file     Social Determinants of Health     Financial Resource Strain: Patient Declined (12/30/2022)    Received from [facility], [facility]    Overall Financial Resource Strain (CARDIA)     Difficulty of Paying Living Expenses: Patient declined   Food Insecurity: Patient Declined (12/30/2022)    Received from [facility], [facility]    Hunger Vital Sign     Worried About Running Out of Food in the Last Year: Patient declined     Ran Out of Food in the Last Year: Patient declined   Transportation Needs: Patient Declined (12/30/2022)    Received from [employer], [facility]    PRAPARE - Transportation     Lack of Transportation  (Medical): Patient declined     Lack of Transportation (Non-Medical): Patient declined   Physical Activity: Not on file   Stress: Not on file   Social Connections: Unknown (10/22/2021)    Received from [facility], [facility]  Social Network     Social Network: Not on file   Intimate Partner Violence: Unknown (09/13/2021)    Received from [facility], [facility]    Intimate Partner Violence     Fear of Current or Ex-Partner: Not asked     Emotionally Abused: Not asked     Physically Abused: Not asked     Sexually Abused: Not asked   Housing Stability: Patient Declined (12/30/2022)    Received from [facility], [facility]    Housing Stability Vital Sign     Unable to Pay for Housing in the Last Year: Patient declined     Number of Places Lived in the Last Year: 0     Unstable Housing in the Last Year: Patient declined           No Known Allergies   mexiletine Take 1 capsule (250 mg) by mouth.      Multiple Vitamin (MULTIVITAMIN ADULT PO) 1 tablet by Oral route daily.       No birth history on file.    Reviewed family history:   No family history on file.    Examination:    VITAL SIGNS: BP 101/64 (BP Location: Right arm, BP Patient Position: Sitting, BP cuff size: Regular)   Pulse 55   Temp 97.1 F (36.2 C) (Temporal)   Resp 16   Ht 5\' 8"  (1.727 m)   Wt 49.4 kg (109 lb)   SpO2 100%   BMI 16.57 kg/m    General:     NEUROLOGIC EXAMINATION:  Mental Status:  The patient was alert and oriented    Cranial Nerves:  EOM intact. Visual fields were intact to confrontation. Pupils were equal, round, and reactive to light. There was no abnormal nystagmus. Facial sensation symmetric.Elijah Park Hearing intact to finger rub b/l. Palate elevation symmetric.  5/5 strength in trapezius b/l. Tongue midline.     Moderate facial weakness, temporal wasting ,   Motor:   Normal tone, some reduced bulk   Can rise from chair without use of arms ,able to squat  Upper Extremity   Rating (R/L) Lower Extremity  (on table) Rating  (R/L)    Neck extensors      Neck flexors Previously 4     Deltoid 5-/5- Hip Flexors 5-/5   Int Rotators * Hip Extensors  5/5   Ext Rotators * Hip Adductors 5/5   Biceps 5/5 Hip Abductors 5/5   Triceps 4+/4+ Quadriceps 5/5   Wrist Flexors 5/5 Hamstring  5/5   Wrist Extensors 4+/4+ Ankle Dorsiflexors 4+/4+   Finger flexion (digits II/III) 4/4 Ankle Plantarflexors 5/5   Finger flexion (digits IV/V) 4/4 Ankle Everters  5/5   Finger Extensors 4+/4+ Ankle Inverters 5/5   Interossei 5-/5- EHL *   APB  *     Supination  *     Pronation   *        *     *Not tested    Data:  I have personally reviewed patient's labs -  HgA1c 5.1  TSH Noraml  Lipid HDL low at 51  B12 low at 657**    From outside chart   CTG repeats 200-250 on DMPK     TTE 12/2021: Left Ventricle: Systolic function is normal. EF: 65-70%.  Left Ventricle: Left ventricle size is normal.   Left Ventricle: Wall thickness is normal.  EKG 12/2021: sinus bradycardia    MRI Cspine 06/14/2021:  Reported normal   and T spine  2022:  Acute-subacute compression fractures T5 and %6     Impression:  Pt is a 22 year old male who presents to establish care for myotonic dystrophy type I, currently in school here at [employer].    Reviewed plan below, main issues currently are nutrition/swallowing issues, reports reduced meal intake due to "hypergag" reflex, not sure of etiology, plan to saw speech and noted mild oropharyngeal dysphagia, saw ENT w/ no .    Spirometry with some mild changes, could be responsible for voice , plan for referral to pulm     We also discussed clinical trials, will try to get him looped in for [facility]  trial    Plan:    Continue to follow with speech   2. PFT testing for baseline, every 6months- one year- referral to pulm   3. Last EKG was in June 2024, repeat in one year, did have holter and had low HR to 33   4. Last Echo 12/2021 reported normal, repeat in 3-5 years  6. Mexiletine 250 mg BID,- not taking this but encouraged to restart for myotonia   7. Patient  reports annual eye exam  8. Blood work every year       Return to clinic in 3 months .        Elijah Betzold, DO  Neuromuscular Staff     I personally spent total 30  minutes in face-to-face and non-face-to-face activities  related to the patient's visit today, excluding any separately reportable  services/procedures

## 2023-05-25 ENCOUNTER — Ambulatory Visit (INDEPENDENT_AMBULATORY_CARE_PROVIDER_SITE_OTHER): Payer: Commercial Managed Care - POS

## 2023-05-25 ENCOUNTER — Encounter (INDEPENDENT_AMBULATORY_CARE_PROVIDER_SITE_OTHER): Payer: Self-pay

## 2023-05-25 VITALS — BP 101/64 | HR 55 | Temp 97.1°F | Resp 16 | Ht 68.0 in | Wt 109.0 lb

## 2023-05-25 DIAGNOSIS — G7111 Myotonic muscular dystrophy: Secondary | ICD-10-CM

## 2023-05-26 ENCOUNTER — Other Ambulatory Visit (HOSPITAL_COMMUNITY): Payer: Self-pay

## 2023-05-26 ENCOUNTER — Other Ambulatory Visit: Payer: Self-pay

## 2023-05-26 ENCOUNTER — Encounter (INDEPENDENT_AMBULATORY_CARE_PROVIDER_SITE_OTHER): Payer: Self-pay

## 2023-05-26 ENCOUNTER — Encounter (HOSPITAL_COMMUNITY): Payer: Self-pay

## 2023-05-27 ENCOUNTER — Other Ambulatory Visit (HOSPITAL_COMMUNITY): Payer: Self-pay

## 2023-05-27 ENCOUNTER — Encounter (INDEPENDENT_AMBULATORY_CARE_PROVIDER_SITE_OTHER): Payer: Self-pay

## 2023-05-30 ENCOUNTER — Encounter (INDEPENDENT_AMBULATORY_CARE_PROVIDER_SITE_OTHER): Payer: Self-pay

## 2023-06-08 ENCOUNTER — Other Ambulatory Visit (HOSPITAL_COMMUNITY): Payer: Self-pay

## 2023-07-10 ENCOUNTER — Encounter (INDEPENDENT_AMBULATORY_CARE_PROVIDER_SITE_OTHER): Payer: Self-pay | Admitting: Hospital

## 2023-07-13 ENCOUNTER — Encounter (HOSPITAL_BASED_OUTPATIENT_CLINIC_OR_DEPARTMENT_OTHER): Payer: Self-pay | Admitting: Speech Therapy

## 2023-07-13 ENCOUNTER — Encounter (INDEPENDENT_AMBULATORY_CARE_PROVIDER_SITE_OTHER): Payer: Commercial Managed Care - POS | Admitting: Speech Therapy

## 2023-07-23 ENCOUNTER — Encounter (INDEPENDENT_AMBULATORY_CARE_PROVIDER_SITE_OTHER): Payer: Self-pay | Admitting: Hospital

## 2023-07-23 ENCOUNTER — Encounter (INDEPENDENT_AMBULATORY_CARE_PROVIDER_SITE_OTHER): Payer: Self-pay

## 2023-07-25 ENCOUNTER — Encounter (INDEPENDENT_AMBULATORY_CARE_PROVIDER_SITE_OTHER): Payer: Self-pay

## 2023-07-28 ENCOUNTER — Encounter (INDEPENDENT_AMBULATORY_CARE_PROVIDER_SITE_OTHER): Payer: Self-pay | Admitting: Hospital

## 2023-07-28 NOTE — Telephone Encounter (Signed)
 Message routed to provider

## 2023-07-29 ENCOUNTER — Ambulatory Visit (INDEPENDENT_AMBULATORY_CARE_PROVIDER_SITE_OTHER): Payer: Commercial Managed Care - POS | Admitting: Speech Therapy

## 2023-07-29 DIAGNOSIS — R1312 Dysphagia, oropharyngeal phase: Secondary | ICD-10-CM

## 2023-07-29 DIAGNOSIS — R490 Dysphonia: Secondary | ICD-10-CM

## 2023-07-29 DIAGNOSIS — R1310 Dysphagia, unspecified: Secondary | ICD-10-CM

## 2023-07-29 NOTE — Interdisciplinary (Signed)
 [facility]     [facility]  Division of Head and Neck Surgery    Speech-Language Pathology  Treatment Note     Subjective  Swallowing is doing a little better thanks to avoiding problematic textures, and using liquid wash throughout meals.  Voice feels about the same as before; no improvement. He has tried strategies and exercises from before, but this hasn't really felt helpful.    Pain: +0/10; none reported/quantified by patient today    Objective  Voice Therapy: (CPT [phone])    Patient Education: Reviewed prior exercises and strategies. Tested them again today. They are effective, I think we just need to practice how to implement them in conversation.   Exercise/Technique Modifications: Increasing vocal loudness improved dysphonia with reduced instability, fewer cracks into falsetto  Focusing on maintaining breath support and adequate replenishing breaths was helpful in reducing instability and vocal fry  Focusing on maintaining a real pitch at the ends of sentences helped prevent loss of breath support  He identified trying to speak in a deeper voice as helpful    Best trial of rainbow passage today was near normal, with 2 borderline flips into head voice lasting only a moment; he corrected them so quickly they were barely noticeable.    Practiced mainly at level of reading aloud, but we did also practice in conversational speech for a few minutes. Encouraged him to build this up the speech hierarchy.   Response: He verbalized understanding and agreed to practice.     We did not do swallowing therapy today given his reported improvement.     Goals Addressed                   This Visit's Progress     Maintain appropriate speaking register (modal register) >80% of the time during 10 minute casual conversation over 8 weeks   On track     Patient will report reduced gag hypersensitivity and improved ability to tolerate at least 2 meals per day, over 8 weeks   On track      Verbalize and implement dysphagia compensatory strategies to ensure safe and efficient swallowing.   On track          Assessment/Plan  Good progress today. 1 visit remaining. Continue voice therapy per plan of care.    Maree Krabbe, MS, Corporate treasurer    Treatment time: 30 min; 15 min late patient arrival, and he needed to complete a quiz for school during visit

## 2023-07-29 NOTE — Patient Instructions (Signed)
 Please practice the following while reading aloud. Feel free to alternate through strategies to see what works best.  Speak 10-15% louder than normal. In particular, don't let the volume drop at the ends of your phrases or sentences.  Focus on using a real note at the ends of phrases and sentences, instead of letting the voice drop into the crackly sound/vocal fry. You also found focusing on a deeper pitch in general to be helpful, so feel free to experiment with that as well.  Breathing: If saying a long sentence, breathe in the middle to make sure you don't run out of air. Within each breath, focus on gradually, gently tightening your abdominal muscles to help push the air out. When you breathe again, relax the abs and re-start the gradual, gentle squeeze.    After practicing while reading aloud, practice some monologuing/talking out loud. Still rotate through the strategies to see what feels most helpful.  Describe your surroundings.  Describe the main topic of a class you're taking.  Summarize a movie or book plot.  Come up with similarities and differences between random objects while speaking aloud.

## 2023-08-04 ENCOUNTER — Encounter (INDEPENDENT_AMBULATORY_CARE_PROVIDER_SITE_OTHER): Payer: Self-pay

## 2023-08-06 NOTE — Telephone Encounter (Signed)
 Please review and advise if any further action is needed.   The patient's message has been forwarded to the appropriate Provider/Pool, and the patient has been notified via MyChart.   Ensure that any actions taken are documented under the 'DOCUMENTATION' tab.   If additional steps are required, kindly route the encounter back to the appropriate team 'POOL ONLY'.   If no further action is necessary, please mark the message as 'DONE'.     Thank you!    Svara Twyman Juliann Pulse  Signature Derived from Controlled Access Password, August 06, 2023, 1:18 PM.

## 2023-08-11 ENCOUNTER — Encounter (INDEPENDENT_AMBULATORY_CARE_PROVIDER_SITE_OTHER): Payer: Self-pay | Admitting: Hospital

## 2023-08-12 ENCOUNTER — Ambulatory Visit (INDEPENDENT_AMBULATORY_CARE_PROVIDER_SITE_OTHER): Payer: Commercial Managed Care - POS | Admitting: Speech Therapy

## 2023-08-12 DIAGNOSIS — R490 Dysphonia: Secondary | ICD-10-CM

## 2023-08-12 NOTE — Interdisciplinary (Signed)
 [facility]     [facility] Voice [facility] Swallowing  Division of Head and Neck Surgery    Speech-Language Pathology  Treatment Note     Subjective  Pt reports voice is ~40% better than when we started.  He knows how to make it clear, but this still takes effort/focus.  Best strategies to improve voice: deeper breaths, slightly lower pitch, using abdominal muscles  Abdominal effort makes biggest difference    Pain: +0/10    Objective  Answered questions and provided education about physiology of voice breaks, usually related to abrupt unintentional register shift due to unstable or poorly coordinated airflow, subglottic pressure.  His f0 is WNL for age and gender: 142 Hz, inflection 121-536 Hz  Discussed that if strategies work and simply take focus, then we need to work on gradually increasing cognitive load to help habituate these techniques.  Instruction provided in speech hierarchy, gradually increasing complexity/cognitive load.  He initially did not find this helpful, but he was not consciously using strategies during these tasks. Reminded him that the goal is to habituate these strategies so he needs to maintain them while increasing cognitive load; this was effective in maintaining clear modal register voice >90% of the time.  Answered questions about dysphonia related to myotonic dystrophy.     Goals Addressed                   This Visit's Progress     Maintain appropriate speaking register (modal register) >80% of the time during 10 minute casual conversation over 8 weeks   Goal Met     Patient will report reduced gag hypersensitivity and improved ability to tolerate at least 2 meals per day, over 8 weeks   Goal Met     Verbalize and implement dysphagia compensatory strategies to ensure safe and efficient swallowing.   Goal Met          Assessment/Plan  Strategies are effective in improving voice. It will be up to patient to habituate these. Dysphagia/gagging has improved, but patient attributes  this maybe to diet texture changes. I offered additional voice therapy if desired, but patient would like to work on his own for now. He will get in touch if he needs additional help with his voice later on.    Elijah Reynolds, MS, Corporate Treasurer    Treatment time: 50

## 2023-08-21 NOTE — Addendum Note (Signed)
 Addended by: Abelardo Seidner V on: 08/21/2023 10:28 AM     Modules accepted: Orders

## 2023-08-24 ENCOUNTER — Encounter (HOSPITAL_COMMUNITY): Payer: Self-pay

## 2023-08-24 ENCOUNTER — Encounter (HOSPITAL_COMMUNITY): Payer: Self-pay | Admitting: Cardiovascular Disease

## 2023-08-24 ENCOUNTER — Ambulatory Visit: Payer: Commercial Managed Care - POS | Attending: Cardiovascular Disease | Admitting: Cardiovascular Disease

## 2023-08-24 ENCOUNTER — Telehealth (HOSPITAL_COMMUNITY): Payer: Self-pay

## 2023-08-24 VITALS — BP 104/65 | HR 47 | Temp 97.4°F | Resp 17 | Ht 68.0 in | Wt 106.3 lb

## 2023-08-24 DIAGNOSIS — R9431 Abnormal electrocardiogram [ECG] [EKG]: Secondary | ICD-10-CM | POA: Insufficient documentation

## 2023-08-24 DIAGNOSIS — G7111 Myotonic muscular dystrophy: Secondary | ICD-10-CM | POA: Insufficient documentation

## 2023-08-24 NOTE — Telephone Encounter (Signed)
 Event monitor ordered by Dr. Chloe Counter.   Patient previously has been billed for event monitor. Please obtain auth and check for copay before mailing to patient. Routing to [place] and event monitor pool, thank you

## 2023-08-24 NOTE — Progress Notes (Signed)
 Previously seen by Dr. Rupert DELENA Carrie at [facility].   Hx marked sinus bradycardia and myotonic muscular dystrophy type 1.          Echo 2D 12/12/2021 [facility]   Left Ventricle   Left ventricle size is normal. The calculated left ventricular ejection fraction is 69%. Wall thickness is normal. Systolic function is normal. EF: 65-70%. Wall motion is normal. Doppler parameters indicate normal diastolic function.     Right Ventricle   Right ventricle size is normal. Prominent moderator band. Systolic function is normal. Normal tricuspid annular plane systolic excursion (TAPSE) >1.7 cm. Normal fractional area change >35%.     Left Atrium   Left atrium size is normalLeft atrium volume index is normal (16-34 mL/m2). Atrial septum appears intact.     Right Atrium   Right atrium size is normal. Volume appears normal.     IVC/SVC   The inferior vena cava demonstrates a diameter of <=2.1 cm and collapses >50%; therefore, the right atrial pressure is estimated at 3 mmHg.     Mitral Valve   The mitral valve has normal structure and function. The leaflets are not thickened and exhibit normal excursion. There is trace regurgitation. There is no evidence of mitral valve stenosis.     Tricuspid Valve   Tricuspid valve structure is normal. The leaflets are not thickened and exhibit normal excursion. There is trace regurgitation. There is no evidence of tricuspid valve stenosis. The right ventricular systolic pressure is normal (<36 mmHg).     Aortic Valve   The aortic valve is tricuspid. The leaflets are not thickened and exhibit normal excursion. There is no regurgitation or stenosis.     Pulmonic Valve   The pulmonic valve was not well visualized. Pulmonic valve Is not thickened with normal excursion. Trace regurgitation. There is no evidence of pulmonic valve stenosis.     Ascending Aorta   The aortic root is normal in size. The ascending aorta is normal in size.     Pericardium   There is no pericardial  effusion.

## 2023-08-24 NOTE — Patient Instructions (Signed)
 To schedule Echo: [phone]    1 year follow-up, please call us  in a few months to schedule. [phone].      You will receive an event monitor by mail. We will contact you if there is an authorization issue.       How to Contact us :  Nurse [facility]hor Tel: [phone] Mon-Thurs.   For urgent matters please [facility] [phone].

## 2023-08-25 DIAGNOSIS — R9431 Abnormal electrocardiogram [ECG] [EKG]: Secondary | ICD-10-CM

## 2023-08-25 LAB — ECG 12-LEAD
ATRIAL RATE: 43 {beats}/min
P AXIS: 77 degrees
PR INTERVAL: 170 ms
QRS INTERVAL/DURATION: 108 ms
QT: 438 ms
QTc (Bazett): 370 ms
QTc (Fredericia): 392 ms
R AXIS: 88 degrees
T AXIS: 52 degrees
VENTRICULAR RATE: 43 {beats}/min

## 2023-08-26 NOTE — Progress Notes (Signed)
 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     08/27/2023:  Symptoms still about the same , feels that weakness is about the same, this is same   He is not sure about myotonia, not sure if it is limiting his activity , still not on mexiletine   HR 43 on recent EKG, pending echocardiogram, pending event monitor , no cardiac symptoms  Used to run, always had low HR   He does have SOB -more out of breath with laying flat, elevates himself   Sleep still sleeping issues, naps would be around 6 hours, still an issue , tried modafinil in the past and not sure it helped , sometimes snoring   He feels same or less with weight   Did speech therapy but not nutrition   Blurred vision with sleep   Constipation is doing ok .    Last clinic visit   He reports some worsening with symptoms with cold weather.     Weakness: reports about the same, , if he stays upright and having liquids after this is better, also had some acid reflux. Still present but not as badm   Myotonia: more frequent myotonia, maybe due to old, not taking his mexiletine because it was not helping   Cardiac symptoms: no changes , referral to cardiology , last visit echo in July 2023 was normal , prolonged HR monitor low 33 HR at lowest, no other cahnges   Respiratory: spirometry showed MEP and MIP low, does have worsening shortness of breath with laying down.     ENT said that vocal cords were ok.   He does feel that he is losing weight, he was only eating one meal a day. Has not been able to get nutrition follow up   Sleep has been about the same, sleeping more then he should , going to bed around 12-1 , sleeping in  evenings      Swallow study   Patient presents with minimal oropharyngeal dysphagia. Intact safety. Mild efficiency impairment due to incomplete epiglottic inversion with liquids, and subtle pharyngeal weakness, resulting in vallecular, and posterior hypopharyngeal wall residue, which cleared with liquid wash. When gagging sensation occurred, oropharynx was clear. AP screen was notable for mild stasis at level of clavicles; I question if this is triggers his gagging sensation. AP screen also notable for slow clearance through distal esophagus.       Initial history:  He had onset of symptoms with grip weakness in December 2022, diagnosed in 2023. No family history. After he was diagnosed, his father had a low repeat, he is in his 10s, now starting to have symptoms.     Myotonic Dystrophy ROS:  Weakness: grip strength is predominantly affected , had trouble overusing muscles, facial muscles -does have some trouble with chewing, swallowing, has to avoid dry food- avoids bread/steak, has not had a swallow study or speech therapy , has trouble with a hypergag reflex, had this in 2020 and tried gabapentin for this and it  helped but no longer happening. He is only able to eat one meal a day, started using boost, he has not lost a lot of weight , he is having some foot weakness, no falls but fatigue with walking   Cardiac symptoms: June 2024, HR 42, sinus bradycardia , denies palpitations, chest pain, some chest muscle spasms  Respiratory symptoms: he denies shortness of breath, orthopnea   Myotonia: does w/ hand, jaw locks up in the morning and has slurred speech . He takes mexiletine 250 mg in AM and in PM, does get some acid reflux with this , tried nexium/tums   Sleep/fatigue: his sleep is fragmented, will sleep excessively, modafinal made him stay awake but would sleep for 12 hours , he has fallen asleep while driving   Cognitive: Denies memory changes, feels like has had poor memory  issues  Infertility/Diabetes:  GI symptoms: He does get constipation, not taking anything for this, does not drink a lot of water , food gets stuck in chest   Vision/cataracts: no current vision changes, astigmatism, wears contacts   Hearing impairment: he does have bad hearing       He is majoring in engineer, manufacturing systems, 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 His family is in [place] , sister lives in [place] area       Plan per Dr. Kip   - Referral to [facility] clinic, OT through [facility]  - Annual eye exam and cardiology exam  - Trial of low dose Periactin for appetite, discussed that he can take nightly initially to try and regulate his  sleep cycle a bit as well  - Pending clinical progress will consider adding daytime Ritalin for excessive daytime somnolence  -Sleep clinic referral to discuss additional options for his nighttime insomnia and excessive daytime  somnolence  - Mexiletine 250 mg BID  - Follow up TSH, A 1 C, and lipid profile yearly with PCP  -Interested in clinical trials/ASO trials, may be a candidate if he takes time off school, recommended  AVIDITY study as a possibility  -[facility] MDA clinic in January 2025    Reviewed past medical history:      Reviewed social history:  Was using THC for sleep and myotonia but stopped this       Social History     Socioeconomic History    Marital status: Single     Spouse name: Not on file    Number of children: Not on file    Years of education: Not on file    Highest education level: Not on file   Occupational History    Not on file   Tobacco Use    Smoking status: Never    Smokeless tobacco: Never   Substance and Sexual Activity    Alcohol use: Not on file    Drug use: Not on file    Sexual activity: Not on file   Other Topics  Concern    Not on file   Social History Narrative    Not on file     Social Determinants of Health     Financial Resource Strain: Patient Declined (12/30/2022)    Received from [facility], [facility]  Overall Financial Resource Strain (CARDIA)     Difficulty of Paying Living Expenses: Patient declined   Food Insecurity: Patient Declined (12/30/2022)    Received from [facility], [facility]    Hunger Vital Sign     Worried About Running Out of Food in the Last Year: Patient declined     Ran Out of Food in the Last Year: Patient declined   Transportation Needs: Patient Declined (12/30/2022)    Received from [facility], [facility]    PRAPARE - Transportation     Lack of Transportation (Medical): Patient declined     Lack of Transportation (Non-Medical): Patient declined   Physical Activity: Not on file   Stress: Not on file   Social Connections: Unknown (10/22/2021)    Received from [facility], [facility]    Social Network     Social Network: Not on file   Intimate Partner Violence: Unknown (09/13/2021)    Received from [facility], [facility]    Intimate Partner Violence     Fear of Current or Ex-Partner: Not asked     Emotionally Abused: Not asked     Physically Abused: Not asked     Sexually Abused: Not asked   Housing Stability: Patient Declined (12/30/2022)    Received from [facility], [facility]    Housing Stability Vital Sign     Unable to Pay for Housing in the Last Year: Patient declined     Number of Places Lived in the Last Year: 0     Unstable Housing in the Last Year: Patient declined           No Known Allergies   mexiletine Take 1 capsule (250 mg) by mouth.      Multiple Vitamin (MULTIVITAMIN ADULT PO) 1 tablet by Oral route daily.       No birth history on file.    Reviewed family history:   No family history on file.    Examination:    VITAL SIGNS: BP 111/73 (BP Location: Right arm, BP Patient Position: Sitting, BP cuff size: Regular)   Pulse 50   Temp 97.3 F (36.3 C)  (Temporal)   Ht 5' 9.6 (1.768 m)   Wt 48.5 kg (107 lb)   SpO2 100%   BMI 15.53 kg/m    General:     NEUROLOGIC EXAMINATION:  Mental Status:  The patient was alert and oriented    Cranial Nerves:  EOM intact. Visual fields were intact to confrontation. Pupils were equal, round, and reactive to light. There was no abnormal nystagmus. Facial sensation symmetric.SABRA Hearing intact to finger rub b/l. Palate elevation symmetric.  5/5 strength in trapezius b/l. Tongue midline.     Moderate facial weakness, temporal wasting     Motor:   Normal tone, some reduced bulk   Can rise from chair without use of arms ,able to squat  Upper Extremity   Rating (R/L) Lower Extremity  (on table) Rating (R/L)    Neck extensors      Neck flexors Previously 4     Deltoid 4+/4+ Hip Flexors    Int Rotators * Hip Extensors  5/5   Ext Rotators * Hip Adductors 5/5   Biceps 4+/4+ Hip Abductors 5/5   Triceps 4+/4+ Quadriceps 5/5   Wrist Flexors  Hamstring  5/5   Wrist Extensors  Ankle Dorsiflexors 4/4+   Finger flexion (digits II/III)  Ankle Plantarflexors 5/5   Finger flexion (digits IV/V)  Ankle Everters  Finger Extensors 4+/4+ Ankle Inverters    Interossei 5-/5- EHL *   APB  *     Supination  *     Pronation   *        *     *Not tested    Data:  I have personally reviewed patient's labs -  HgA1c 5.1  TSH Noraml  Lipid HDL low at 51  B12 low at 772**    From outside chart   CTG repeats 200-250 on DMPK     TTE 12/2021: Left Ventricle: Systolic function is normal. EF: 65-70%.  Left Ventricle: Left ventricle size is normal.   Left Ventricle: Wall thickness is normal.  EKG 12/2021: sinus bradycardia    MRI Cspine 06/14/2021:  Reported normal   and T spine 2022:  Acute-subacute compression fractures T5 and %6     Impression:  Pt is a 23 year old male who presents to establish care for myotonic dystrophy type I, currently in school here at [employer].    Reviewed plan below, main issues currently are nutrition issues, reports reduced meal intake due to  hypergag reflex, swallow study with some concern for epiglottis issue- referred to GI     Spirometry with some mild changes, could be responsible for voice , plan for referral to pulm -has not seen yet    Patient with persistent bradycardia, did see cardiology who is planning on doing repeat event monitor, echo, EKG stable except with bradycardia    Patient deferred mexiletine    Patient with blurred vision, will trial eye drops but plan for opthalmology referral      We also discussed clinical trials again with him and his mother- sent message to Dr. Jeanice   Plan:    GI/ nutrition referral   2. PFT testing w/ mild changes repeat every 6 months referral to pulm   3. Last EKG 08/2023- saw cardiology, repeat Echo, event monitor   4. Pending sleep study, could consider stimulant again in the future, may also need CPAP   6. Mexiletine 250 mg BID,- not taking this but encouraged to restart for myotonia   7. Ophthalmology referral for blurred vision again   8. Blood work every year   9. Multidsiplinary clinic with dr. Jeanice       Return to clinic in 3 months .        Sherronda Sweigert, DO  Neuromuscular Staff     I personally spent total 30  minutes in face-to-face and non-face-to-face activities  related to the patient's visit today, excluding any separately reportable  services/procedures

## 2023-08-27 ENCOUNTER — Ambulatory Visit (INDEPENDENT_AMBULATORY_CARE_PROVIDER_SITE_OTHER): Payer: Commercial Managed Care - POS

## 2023-08-27 ENCOUNTER — Telehealth (INDEPENDENT_AMBULATORY_CARE_PROVIDER_SITE_OTHER): Payer: Self-pay

## 2023-08-27 VITALS — BP 111/73 | HR 50 | Temp 97.3°F | Ht 69.6 in | Wt 107.0 lb

## 2023-08-27 DIAGNOSIS — G7111 Myotonic muscular dystrophy: Secondary | ICD-10-CM | POA: Diagnosis not present

## 2023-08-27 NOTE — Telephone Encounter (Signed)
 Received call from patient to schedule, he is being referred to our [facility] specialists, please review and advise for scheduling purposes. Thank you for your time.

## 2023-08-28 ENCOUNTER — Telehealth (HOSPITAL_COMMUNITY): Payer: Self-pay | Admitting: Cardiovascular Disease

## 2023-08-28 NOTE — Telephone Encounter (Signed)
 Order placed by Dr. Chloe Counter staff on 08/28/23    Zio patch has been ordered for home delivery.  Please contact Zio for status and/or further questions [phone]

## 2023-08-31 NOTE — Progress Notes (Signed)
 Subjective :    Chief Complaint  Routine follow-up for myotonic dystrophy.    History of Present Illness  The patient is a sales promotion account executive at [employer] with a history of myotonic dystrophy type 1, diagnosed in April 2023. The patient reports 250 GAA repeats. Initial symptoms included weakness in the hands and sleep disturbances. Additional symptoms include facial muscle weakness, difficulty eating, esophageal dysmobility, and dysphagia.    The patient denies any known heart issues but notes a consistently low heart rate. Their resting heart rate was previously in the low thirties to high twenties, and is currently around 40 bpm. The patient denies any history of fainting or easy dizziness. They report occasional chest pain, which they attribute to muscle spasms. The patient denies shortness of breath.    Previously an avid runner in high school, the patient's current exercise routine is inconsistent. They report playing pickleball twice a week when at home but have reduced activity due to academic demands. During intense exercise, their heart rate increases to the low 100s.    The patient denies alcohol consumption, vaping, or smoking. Family history is significant for atrial fibrillation in the patient's father.    Review of Systems  - Musculoskeletal: Weakness in hands, facial muscle weakness  - Sleep: Sleep has been weird  - Gastrointestinal: Difficulty eating, esophageal dysmobility, dysphagia  - Cardiovascular: Heart rate usually runs on the lower side, sometimes experiences chest pain (described as more of a muscle spasm)  - Respiratory: No shortness of breath    Objective :    Vital Signs  - Blood pressure: 105/65 mmHg  - Heart rate: 47 bpm    Physical Examination  - Cardiovascular: Heart sounds normal.  - Respiratory: Lungs sound normal.  - Extremities: No swelling in legs.    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 Genetic testing revealed 250 GAA repeats. Symptoms include weakness in hands, sleep disturbances, facial muscle weakness, difficulty eating, esophageal dysmobility, and dysphagia. Patient reports occasional chest pain attributed to muscle spasms. No significant cardiac symptoms such as fainting or dizziness. EKG shows sinus bradycardia with heart rate of 43 bpm, which is normal for this patient. Blood pressure 105/65, heart rate 47 bpm. Physical exam revealed no edema, normal heart sounds, and clear lungs.  - Holter monitor for 2 weeks every 2 years  - Echocardiogram every 2 years (due in 6 months)  - Exercise recommendation: consistent physical activity at least twice weekly, aiming for 20 minutes or more of elevated heart rate  - Annual follow-up appointment  - Monitor for symptoms such as lightheadedness or fainting  - Patient advised to report any new or worsening symptoms    Deconditioning  Patient reports significant decrease in exercise routine since high school, where they were involved in cross country track. Current exercise regimen is inconsistent, with occasional gym visits. Resting heart rate has increased from low 30s/high 20s bpm in high school to current 40 bpm, suggesting some deconditioning.  - Encourage regular exercise, aiming for at least twice weekly gym visits or enjoyable activities like pickleball  - Monitor heart rate response during exercise

## 2023-08-31 NOTE — Telephone Encounter (Signed)
 Pt can see NP Clarke in [facility]

## 2023-09-02 NOTE — Telephone Encounter (Signed)
 Unable to LVM. Requesting call back. If patient calls back please assist with scheduling appointment with NP Adron Horns.

## 2023-09-04 ENCOUNTER — Telehealth (INDEPENDENT_AMBULATORY_CARE_PROVIDER_SITE_OTHER): Payer: Self-pay

## 2023-09-04 NOTE — Telephone Encounter (Signed)
 Received call from patient following up on his referral for Nutritional Counseling. The referral has not been processed yet. I added a communications note and changed the flag type to Miscellaneous but for some reason it is not falling in the referral work queue. Please reach out to patient once benefits have been obtained.

## 2023-09-04 NOTE — Telephone Encounter (Signed)
 Patient called back and has been scheduled for next available ECC.

## 2023-09-07 ENCOUNTER — Encounter (HOSPITAL_BASED_OUTPATIENT_CLINIC_OR_DEPARTMENT_OTHER): Payer: Self-pay

## 2023-09-07 DIAGNOSIS — G7111 Myotonic muscular dystrophy: Secondary | ICD-10-CM

## 2023-09-08 ENCOUNTER — Encounter (INDEPENDENT_AMBULATORY_CARE_PROVIDER_SITE_OTHER): Payer: Self-pay | Admitting: Hospital

## 2023-09-08 ENCOUNTER — Telehealth (INDEPENDENT_AMBULATORY_CARE_PROVIDER_SITE_OTHER): Payer: Commercial Managed Care - POS | Admitting: Critical Care

## 2023-09-08 ENCOUNTER — Telehealth (INDEPENDENT_AMBULATORY_CARE_PROVIDER_SITE_OTHER): Payer: Self-pay | Admitting: Critical Care

## 2023-09-08 VITALS — Ht 69.6 in

## 2023-09-08 DIAGNOSIS — G4719 Other hypersomnia: Secondary | ICD-10-CM

## 2023-09-08 DIAGNOSIS — G7111 Myotonic muscular dystrophy: Secondary | ICD-10-CM

## 2023-09-08 DIAGNOSIS — G4720 Circadian rhythm sleep disorder, unspecified type: Secondary | ICD-10-CM

## 2023-09-08 NOTE — Progress Notes (Signed)
 Respiratory Care Note: Assisted Ventilation Clinic    Elijah Park is a 23 year old male seen today and all information reviewed with Dr. Imogene Burn for a new patient  visit. Patient has a history of Myotonic Muscular Dystrophy (MMD). Respiratory therapist Nicanor Bake, RRT, completed the initial assessment.    Spirometry     04/03/2023:  Spirometry Completed: [x]  Yes    FVC%: 86% (3.98 L) of predicted  Supine FVC%: 86% (4.0 L) of predicted  Peak cough flow: 470 lpm  MEP: 18% (42.20 cmH2O) of predicted  MIP: 42% (-54.29 cmH2O) of predicted       History     Cross-Clinic Care:  [x]  No   []  Yes:  []  ALS []  MDA []  HMV []  CP  []  Other:      Assessment & History:  Smoking History:   [x]  Former, Pack-Year: D-10  []  Current []  Never  History of: []  COPD []  Asthma []  OSA []  Polio [x]  None    Hospitalization or ER visit since last visit (Visit date not found):  [x]  No   []  Yes:     Significant Symptoms:  []  No  []  Yes: Listed Below    Shortness of breath    Does patient have a PEG: [x]  No  []  Yes:   Eats by mouth:  []  No  [x]  Yes: 100%   Difficulty Swallowing : []  No  [x]  Yes  Choking: [x]  No  []  Yes:     Sleep Attributes:  Hours:   Perceived quality of sleep: [] Good [] Fair [] Poor    Sleep position:  [] Flat; Pillows:     [] Incline, type: , Pillows:      Nocturnal wakening: [] No  [] Yes, Reason:  times,     Daytime Symptom: [] Fatigue [x] Sleepiness [] Both  [] None  Daytime Naps: [] No [x] Yes: once a week; 6 hours  Morning HA: [x] No [] Yes    Results:    TCO2 Values:     Date Result                 Epworth Sleepiness Scale Results:        No data to display                 Lab Results:  Office Visit on 08/24/2023   Component Date Value    VENTRICULAR RATE 08/24/2023 43     ATRIAL RATE 08/24/2023 43     PR INTERVAL 08/24/2023 170     QRS INTERVAL/DURATION 08/24/2023 108     QT 08/24/2023 438     QTc (Bazett) 08/24/2023 370     QTc (Fredericia) 08/24/2023 392     P AXIS 08/24/2023 77     R AXIS 08/24/2023 88     T AXIS 08/24/2023 52      ECG INTERPRETATION 08/24/2023                      Value:Marked sinus bradycardia  Abnormal ECG    Confirmed by Computer result only, MD will review. ([id number]), editor Zenaida Niece [id number]) on 08/25/2023 10:15:41 AM     Hospital Outpatient Visit on 04/03/2023   Component Date Value    FVC 04/03/2023 3.98     FVC % PREDICTED 04/03/2023 86     FEV1  04/03/2023 3.69     FEV1 % PREDICTED 04/03/2023 93     FEV1/FVC 04/03/2023 0.93     FEV1/FVC % PREDICTED 04/03/2023 106     FEF  25/75 04/03/2023 5.12     FEF 25/75 % PREDICTED 04/03/2023 116     MVV 04/03/2023 110.85     MVV % PREDICTED 04/03/2023 76     MEP 04/03/2023 42.20     MEP % PREDICTED 04/03/2023 18     MIP 04/03/2023 -54.29     MIP % PREDICTED 04/03/2023 42     PFT INTERPRETATION 04/03/2023                      Value:Spirometry appears normal.  The FEV1, FVC and FEV1/FVC ratio are within normal limits.  Flow volume loop appears normal.  Inspiratory and expiratory flows are within normal limits. No previous spirometry for comparison.With the patient in the supine   position, the FVC did not decrease below the normal limit of 15%.The maximum voluntary ventilation (MVV) was 30.02 (less than 35) times the FEV1, which suggests that the endurance of the muscles of ventilation may be compromised.The Maximum   Inspiratory Pressures and Maximum Expiratory Pressures generated by patient are abnormally low.  This finding is typical of patients with neuromuscular defects or weakness.     Admission on 03/31/2023, Discharged on 04/01/2023   Component Date Value    Type 03/31/2023 Clean catch     Color 03/31/2023 Yellow     Appearance 03/31/2023 Turbid     Specific Gravity 03/31/2023 1.029     pH 03/31/2023 6.5     Protein 03/31/2023 1+ (A)     Glucose 03/31/2023 Negative     Ketones 03/31/2023 Negative     Bilirubin 03/31/2023 Negative     Blood 03/31/2023 3+ (A)     Urobilinogen 03/31/2023 Negative     Nitrite 03/31/2023 Negative     Leuk Esterase 03/31/2023 250 (A)     WBC  03/31/2023 >50 (A)     RBC 03/31/2023 >50 (A)     Bacteria 03/31/2023 None     Urine Culture Result 03/31/2023 Mixed Urogenital Flora     Glucose 04/01/2023 86     BUN 04/01/2023 17     Creatinine 04/01/2023 1.16     eGFR Based on CKD-EPI 20* 04/01/2023 >60     Sodium 04/01/2023 143     Potassium 04/01/2023 4.1     Chloride 04/01/2023 107     Bicarbonate 04/01/2023 26     Anion Gap 04/01/2023 10     Calcium 04/01/2023 9.4     Total Protein 04/01/2023 6.6     Albumin 04/01/2023 4.2     Bilirubin, Tot 04/01/2023 0.38     AST (SGOT) 04/01/2023 14     ALT (SGPT) 04/01/2023 9     Alkaline Phos 04/01/2023 61     WBC 04/01/2023 8.5     RBC 04/01/2023 5.56     Hgb 04/01/2023 14.7     Hct 04/01/2023 43.2     MCV 04/01/2023 77.7 (L)     MCH 04/01/2023 26.4     MCHC 04/01/2023 34.0     RDW 04/01/2023 12.7     MPV 04/01/2023 11.5     Plt Count 04/01/2023 173     Segs 04/01/2023 52.7     Imm Gran % 04/01/2023 0.2     Lymphocytes 04/01/2023 39.0     Monocytes 04/01/2023 6.3     Eosinophils 04/01/2023 1.3     Basophils 04/01/2023 0.5     ANC-Automated 04/01/2023 4.5     Imm Gran Abs 04/01/2023 0.0     Abs Lymphs  04/01/2023 3.3 (H)     Abs Monos 04/01/2023 0.5     Abs Eosinophils 04/01/2023 0.1     Abs Basophils 04/01/2023 0.0     Diff Type 04/01/2023 Automated    Urgent Care on 03/31/2023   Component Date Value    Bilirubin 03/31/2023 Negative     Blood 03/31/2023 2+ (A)     Color 03/31/2023 Yellow     Glucose 03/31/2023 Negative     Ketones 03/31/2023 Trace (A)     Leuk Esterase 03/31/2023 1+ (A)     Nitrite 03/31/2023 Negative     pH 03/31/2023 7.0     Protein 03/31/2023 2+ (A)     Specific Gravity 03/31/2023 >=1.030     Urobilinogen 03/31/2023 1.0     Appearance 03/31/2023 Cloudy (A)     Urine Culture Result 03/31/2023 Mixed Urogenital Flora           Visit Free Text Summary     Accompanied by: Self     Logged into visit in tandem with Dr. Imogene Burn. I did not assess the patient independently. Some questions in the template will be  unanswered for this visit.   Wakes up in the morning short of breath a couple times a week. Denies Shortness of breath with physical activity. Expresses that he is tired in the morning and feels sleepy during the day. Tries to stay busy or take a nap although he tries to avoid napping unless necessary.    Naps once a week for 6 hours.   Doesn't sleep at regular times. Falls asleep at 7 am and wakes at 10 pm. This is his consistent schedule since starting college 3 years ago.    He doesn't choke, but it may takes multiple swallows to get food down. Can't eat overly dry food. Diagnosed around November or December of 2024 with esophageal dysmotility so he needs to eat upright and remain upright for 2 hours. Followed by drinking  a good amount of water.   Feels confident that he can clear his airway effectively. Cough strength on recent PFT remains intact.     Patient was encouraged to contact the clinic if he notices any significant respiratory deterioration before the next scheduled clinic visit.        Recommendations     After physician assessment, it has been determined:     Dr. Imogene Burn has requested continued pulmonary management with future spirometry and clinic visits

## 2023-09-08 NOTE — Progress Notes (Signed)
 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   - bradycardia  - got esophageal dysmotility diagnosed after barium swallow in Nov/Dec  - May of 2022 he had a thoracic spine fracture while doing a back flip that was managed conservatively   - previously diagnosed with hyperactive throat muscles/spasms of the uvula       INTERVAL EVENTS  - sometimes feeling SOB in the middle of the night, several times a week, although it does not necessarily wake him up  - tired on waking up usually  - not taking nap every day, but can take a nap for up to 6 hours about once a week  - not exercising regularly  - smoked T10 in the past, not currently  - studying machine learning at [employer]  - sometimes it takes multiple swallows for something to go down  - sleep schedule: sleeping from 7AM-10PM flipped schedule since after college  - having trouble generating and projecting voice, also feels that his voice is hoarse  - some witnessed snoring         REVIEW  OF SYSTEM   Hypoventilation/Neuromuscular/Sleep ROS   []  morning headache []  confusion, or difficulty concentrating  [x]  excessive daytime fatigue/sleepiness  []  insomnia  []  orthopnea []  PND [x]  fragmented sleep  []  weak cough [x]  swallowing difficulty [x]  phonation difficulty  (checked = positive, unchecked = reviewed and negative)    General/Respiratory ROS  []  significant weight loss []  significant weight gain  []  fevers []  cough []  sputum production []  wheezing   []  URI symptoms (pharyngitis, ear pain, rhinorrhea or nasal congestion)   []  sinus disease   []  SOB/breathlessness []  DOE  []  chest pain []  palpitation []  abdominal swelling, or lower leg swelling  []  anxiety []  depression  (checked = positive, unchecked = reviewed and negative)         ASSISTED VENT DEVICE REVIEW  (Personally Reviewed and Interpreted)   [x]  Not applicable    COMFORT/SYNC ISSUES  SETTING PATIENT RESPONSE   Trigger []  The machine comfortably trigger a breath every time you want one.  []  The machine sometimes trigger a breath before you want one.   Rise After you start a breath, the air flow in []  Too slow, []  Too fast, [] About right   Cycle  PS  Ti Your breaths are  []  too small, []  too big, []  just right.  The pressure is []  too little, []  too much, []  just right   Cycle  Ti The machine ends a breath []  too soon, []  too late, []  at the right time.   []  Other complaints:     EQUIPMENT ISSUES  []  Poor mask fit  []  Too much leak    SETTINGS AND DOWNLOAD DATA  []  No remote access  []  Setting components (full setting see RT note):        []  Compliance:    Usage days >= 4 hours <4 hours    % % %     []   Leak EPAP IPAP TV RR MV Va other   Max           95th%           Median/  Average           5th%             []  Residual AHI:   []  % spontaneous triggered:  []  % spontaneous cycled:       PAST HISTORY     Patient Active Problem List   Diagnosis    Myotonic dystrophy, type 1 (CMS-HCC)    Sinus bradycardia       No past medical history on file.    No past surgical history on file.    none     Socioeconomic History    Marital status: Single   Tobacco Use    Smoking status: Never    Smokeless tobacco: Never     Social Determinants of Health     Financial Resource Strain: Patient Declined (12/30/2022)    Received from [facility], [facility]    Overall Financial Resource Strain (CARDIA)     Difficulty of Paying Living Expenses: Patient declined   Food Insecurity: Patient Declined (12/30/2022)    Received from [facility], [facility]    Hunger Vital Sign     Worried About Running Out of Food in  the Last Year: Patient declined     Ran Out of Food in the Last Year: Patient declined   Transportation Needs: Patient Declined (12/30/2022)    Received from [employer], [facility]    PRAPARE - Transportation     Lack of Transportation (Medical): Patient declined     Lack of Transportation (Non-Medical): Patient declined    Received from [employer], [facility]    Social Network   Intimate Partner Violence: Unknown (09/13/2021)    Received from [facility], [facility]    Intimate Partner Violence     Fear of Current or Ex-Partner: Not asked     Emotionally Abused: Not asked     Physically Abused: Not asked     Sexually Abused: Not asked   Housing Stability: Patient Declined (12/30/2022)    Received from [facility], [facility]    Housing Stability Vital Sign     Unable to Pay for Housing in the Last Year: Patient declined     Number of Places Lived in the Last Year: 0     Unstable Housing in the Last Year: Patient declined     Social History     Social History Narrative    Not on file       No family history on file.  He has a sibling with Raynaud's and hand pain/spasms with cold.    His father had early onset cataracts and reports always feeling "weaker" than his peers. His father's siblings had cataracts and unexplained cardiac issues. His father is undergoing genetic testing.     No Known Allergies       MEDICATION       mexiletine Take 1 capsule (250 mg) by mouth.      Multiple Vitamin (MULTIVITAMIN ADULT PO) 1 tablet by Oral route daily.              OBJECTIVE DATA  (Personally Reviewed and interpreted)     VITALS  There were no vitals taken for this visit.    PHYSICAL  General: in NAD.  Lungs: slightly muffled speech  Extremities: no  cyanosis, clubbing, or edema.  Skin:  No rashes or lesions.  Psych: normal affect and mood        LAB DATA (Personally Reviewed and Interpreted)      CBC: BMP:   Lab Results   Component Value Date    WBC 8.5 04/01/2023    HGB 14.7 04/01/2023    HCT 43.2 04/01/2023     PLT 173 04/01/2023    SEG 52.7 04/01/2023    LYMPHS 39.0 04/01/2023    MONOS 6.3 04/01/2023    Lab Results   Component Value Date    NA 143 04/01/2023    K 4.1 04/01/2023    CL 107 04/01/2023    BICARB 26 04/01/2023    BUN 17 04/01/2023    CREAT 1.16 04/01/2023    GLU 86 04/01/2023    [place] 9.4 04/01/2023        Coags: LFTs:   No results found for: "PTT", "INR", "PT"  Lab Results   Component Value Date    ALK 61 04/01/2023    AST 14 04/01/2023    ALT 9 04/01/2023    TBILI 0.38 04/01/2023    TP 6.6 04/01/2023    ALB 4.2 04/01/2023        BNP   No results found for: "BNP", "BNPP"     OTHER       STUDIES (Personally Reviewed and Interpreted)     BLOOD GAS  ABG   No results found for: "ARTPH", "ARTPCO2", "ARTPO2", "ARTHC03", "ARTBE", "ARTO2SAT"     PFT  Date FEV1, % FVC, % FEV1/FVC MIP MEP PCF MVV RV, % TLC, %  DLCO, %   03/2023 3.69, 93% 3.98, 86% 0.93 54, 42% 42, 18% 470 >35       3.73, 94% 4, 86% 0.93            IMAGING  No results found for this or any previous visit.  X-Ray Head and Neck Swallow  Narrative: Table formatting from the original result was not included.    CLINICAL HISTORY:   [Dysphagia].      COMPARISON:   [None].         FINDINGS:   Fluoroscopy was provided for speech therapy evaluation of swallowing   function.      [No static images] are submitted for interpretation.      [Fluoroscopy time] = [ ]  minutes         Impression:    [Please see speech pathology report for further details.]       ECHO  No results found for: "LVEF"     TTE 12/2021: Left Ventricle: Systolic function is normal. EF: 65-70%.  Left Ventricle: Left ventricle size is normal.   Left Ventricle: Wall thickness is normal.  EKG 12/2021: sinus bradycardia    VACCINATION  Immunization History   Administered Date(s) Administered    (Chicken Pox) Varicella Live Vaccine 04/11/2003, 04/29/2007    COVID-19 [employer]) Purple Cap >= 12 Years 08/17/2019, 09/07/2019, 06/04/2020    HPV Unspecified Formulation 05/28/2016, 06/04/2017    Hep-A Ped/Adol  2 Dose Schedule 05/22/2005, 05/06/2006    Hepatitis B Vaccine Adjuvanted (Adult 18 Yrs and Older) Mar 24, 2002, 05/09/2002, 01/11/2003    Inactivated Polio Vaccine (IPV) 06/21/2002, 08/10/2002, 04/11/2003, 04/29/2007    MMR Live Vaccine 04/11/2003, 04/29/2007    Meningococcal B (Bexsero) 2-dose vaccine 07/12/2018, 08/03/2019    Meningococcal Vaccine 05/16/2013, 07/12/2018    Pneumococcal 13 Vaccine (PREVNAR-13) 07/12/2003    Tdap 05/16/2013  ASSESSMENT AND PLAN     Elijah Park is a 23 year old male with Myotonic dystrophy type 1 who also has mild dysphagia, mild dysarthria, excessive daytime sleepiness, poor memory, and bad hearing. He has baseline slow HR but no cardiac involvement otherwise.     His PFT showed a FEV1 and FVC above the lower limit of normal. MIP and MEP were significantly reduced, however, this might be contributed by facial muscle weakness rather than breathing muscle weakness or effort/technique dependent. He denies orthopnea or morning headache.     He does have excessive daytime sleepiness which can be stereotypical for myotonic dystrophy. However, he also has erratic/flipped sleep schedule which can greatly confound the attribution of his EDS. He does have witnessed snoring and non-refreshing sleep that would warrant further screening for sleep disordered breathing or nocturnal hypoventilation.     []  get CXR 2V for baseline  []  get PSG with TCO2. This can be challenging due to his flipped schedule (normally sleep from 7AM to 10PM), but he reports that he can make it work by staying up the day before the sleep study. While this might skew the result to some extent, it should still provide some useful information.   []  advised on reviewing sleep hygiene and make an effort to correct his schedule.   []  may consider sleep medicine/psychologist referral in the future if this continue to present a challenge  []  NMD spirometry annually           Order Placed  Orders Placed This  Encounter   Procedures    PSG Diagnostic    X-Ray Chest Frontal And Lateral       Return to clinic   Return in about 8 months (around 05/09/2024) for face to face.      Total Time Statement  I personally spent total105 minutes in face-to-face (interview, exam, discussion of workup plan and treatment options, risks and benefits of intervention) and non-face-to-face (review of prior medical records, charting, discussion and coordination of care with staff, PCP, or other specialists) activities related to the patient's visit today, excluding interpretation services and any separately reportable services/procedures.  The patient has complex and serious respiratory issues that requires longitudinal and ongoing care with the provider.            Darol Destine, MD  Pulmonary & Critical Care Medicine  [facility]    [email or web address]  Pager: [phone]    Pulmonary Assisted Ventilation Clinic [facility])  [address] [place]  [place], [place] [address]  Tel: [phone]  Fax: [phone]    Neurology Multi-disciplinary Neuromuscular Clinic [facility])  [address], [place], [place] [address]  Tel: [phone]  Fax: [phone]    [facility]  [place]:  [phone]   Loralie Champagne:  [phone]

## 2023-09-08 NOTE — Patient Instructions (Addendum)
 PULMONARY ASSISTED VENTILATION CLINIC  MULTIDISCIPLINARY NEUROMUSCULAR CLINIC    TREATMENT PLAN AND VISIT SUMMARY:    General Plan and Instruction Device Related Plan Diagnostic Tests   Please Google sleep hygiene and follow good sleep hygiene practice  In-lab sleep study  Chest X-ray       IMPORTANT INFORMATION:  If you need to get in touch with me, you may either  Call the clinic: see below for clinic phone number, generally opens from Monday-Friday, 9 AM - 5 PM.  Use [place]: please anticipate 2~3 business days to get a response from me through [place]. You may get a response from the medical assistant or registered nurse earlier than 2~3 business days.   For respiratory device related questions, you can reach the covering respiratory therapist through a number of ways:  Ventilation Clinic RT Email: [email or web address]   Ventilation Clinic RT office phone number (Mon-Fri, 8AM-5PM): [phone]   Ventilation clinic RT mobile phone number (Mon-Fri, 8AM-5PM): [phone]  Supervisor mobile phone number Justin Mend): [phone]  Clinic Fax: [phone]  For emergency, please call 911.   Remote monitoring and non-face-to-face encounter and charges.  If you're on a ventilatory device that requires remote monitoring of the therapy data, you may get a follow-up phone call from the respiratory therapist when your therapy data are reviewed and we need to review your symptoms or adjust the device settings. This may incur a billable encounter that you may be responsible for a copay (amount depending on your insurance, but generally would not be more than the copay for an office visit).   If the procurement of a new device or communication with your other care providers requires the staff to spend additional time 7 days after your most recent clinic visit for care coordination, it may incur a billable encounter that you may be responsible for a copay (amount depending on your  insurance, but generally would not be more than the copay for an office visit).   We use the above charges judiciously and only when there is a necessity. However, this is essential to support our staffing and to meet the demand of our patient population who generally has more complex medical conditions.   Please let us know if you have any questions or if you think there is an unreasonable/erroneous medical bill.     If I've made a referral for a test, for sleep study, or for another specialist, and you have not heard about scheduling this within a week, please get in touch with me.  If you are unable to make your appointment for clinic or an overnight study, kindly call the office at least 48 hours in advance to cancel and reschedule.  If you are on ventilatory device, please bring your device's card to each clinic appointment. If you have a newer machine, we can sometimes download the data remotely, but this is a little more time consuming.  If you are having issues with your ventilatory device or masks, please bring your equipment to clinic appointment so that we can trouble shoot for you.  If you have been asked to come to a sleep study, make sure you bring toiletries, a comfy pillow, and any nighttime medications that you may regularly take. Also be sure to eat dinner before you arrive. We generally do not provide meals.    Pulmonary Assisted Ventilation Clinic [facility])  [address], [place], [place] [address]  Tel: [phone]  Fax: [phone])  [phone]    Neurology Multi-disciplinary Neuromuscular Clinic [facility])  [address], [place], [place] [address]  Tel: [phone]  Fax: [phone]    [facility]:   [place]:  [phone]   Loralie Champagne:  [phone]

## 2023-09-08 NOTE — Telephone Encounter (Signed)
 Left message for pt as a reminder for todays 8 am MCVV. Please complete e check in to start video at 8 am.

## 2023-09-24 ENCOUNTER — Ambulatory Visit

## 2023-09-24 VITALS — Ht 68.0 in | Wt 103.0 lb

## 2023-09-24 DIAGNOSIS — G7111 Myotonic muscular dystrophy: Secondary | ICD-10-CM | POA: Insufficient documentation

## 2023-09-24 NOTE — Interdisciplinary (Signed)
 NUTRITION CONSULT:    Location: [facility]     DATE: September 24, 2023  Time spent with patient: 30 minutes.            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 Pt reports being underweight his whole life and having difficulty swallowing. Pt endorses prior VSS and that he was told strategies to swallow better but that he does get food stuck occasionally. Pt takes sips of water to help him swallow better. He has difficulty with dry foods like bread, dry noodles, burritos. He has tried [facility] but reports that he doesn't tolerate it well without food. He does not feel hungry often and prioritizes everything else over eating, like school, talking, playing games, etc. He does not enjoy eating unless he feels hungry. Discussed the option of small frequent meals throughout the day but reports that this does not work for him. He would rather focus on 2 meals daily. Discussed addition of oral nutrition supplement to be able to better meet needs if he is not able to have more meals daily, open to k[employer] 1.4 trial. Also discussed addition of  multivitamin daily to help better meet needs but pt reports that he has taken them before and not noticed any changes with or without and he does not want to take additional medications. He reports not open to G-tube at this time, he does not believe it is necessary at this point.     Current Eating Pattern: 1 meal daily -doesn't feel hungry enough for more meals, too busy with school.  Breakfast- skip  Lunch- Chicken sandwich & fries.  Dinner - skip  Snacks- Popcorn  Beverages- water 32oz total maybe     Food Allergies & Sensitivities:  None.     No Known Allergies    Dietary Limitations:  Halal food only.  GI Symptoms: Usually constipation.  1-2x/week has a BM.  Religious or cultural influences on food choices:  Halal food only.  Support System & Employment hx: School full-time.  Exercise Patterns: None in addition to walking around campus.  Weight History:     Height: 5'8"   Weight:  103#          There is no height or weight on file to calculate BMI.  Wt Readings from Last 15 Encounters:   08/27/23 48.5 kg (107 lb)   08/24/23 48.2 kg (106 lb 4.8 oz)   05/25/23 49.4 kg (109 lb)   03/31/23 58 kg (127 lb 13.9 oz)   03/31/23 49.9 kg (110 lb)   03/05/23 54 kg (119 lb)   08/01/21 49 kg (108 lb) (<1%, Z= -2.63)*     * Growth percentiles are based on CDC (Boys, 2-20 Years) data.     Lab Results   Component Value Date    NA 143 04/01/2023    K 4.1 04/01/2023    CL 107 04/01/2023    BUN 17 04/01/2023     No results found for: CHOL, HDL, LDLCALC, TRIG     No past medical history on file.  No family history on file.    Current Outpatient Medications:     mexiletine (MEXITIL) 250 MG capsule, Take 1 capsule (250 mg) by mouth., Disp: ,  Rfl:     Multiple Vitamin (MULTIVITAMIN ADULT PO), 1 tablet by Oral route daily., Disp: , Rfl:     Supplements: None.    Estimated Nutrient Needs (47 kg):  1410-1645 kcals/day (30-35 kcal/kg), 56-71 g/day (1.2-1.5 g/kg protein),  1410-1645 mL/day fluids (30-35 ml/kg)    Intervention:   Education: Discussed with the patient that continued weight loss suggests they may not be meeting their nutritional needs. Discussed the implementation of a high protein, high-calorie diet. Recommended the addition of kate farms 1.4 OR boost VHC BID daily in addition to multivitamin to better meet estimated kcal/protein needs and support weight gain. Reviewed various high-calorie protein shake options available on the market that align with the patient's preferences and nutritional requirements. Also discussed the importance of increasing fluid intake to help manage constipation and support overall digestive health.      Handouts Given:  High-calorie, high protein nutrition therapy, G-tube information sheet, RD contact information & Goals Sheet.    Goals:    Aim for 2 meals & [employer] 1.4 via oral intake 2-3x/day to help better meet estimated kcal/protein needs. Sample of Mallie [employer] 1.4 supplement sent to home address in file).  These shakes can be blended into smoothies with ingredients like avocado, nut butter, yogurt, or oats, which add healthy fats and additional protein. For savory options, the shakes can be mixed into soups or pureed dishes like mashed potatoes or casseroles. Additionally, they can be added to coffee, tea, or even oatmeal for a nutritious boost. Adding shakes to baked goods such as muffins or pancakes can also be an effective way to increase calories without changing the meal's texture too much. For snacks, consider using the shakes as a base for puddings or popsicles, offering an easy, enjoyable way to meet dietary goals.  Incorporate calorie dense foods into meals: Full fat dairy (as tolerated) -whole milk, full-fat yogurt, cheese, and cream. Healthy Fats - avocados, nuts & nut butters, and olive oil. Whole grains - quinoa, oats, whole wheat bread, and brown rice. Cook with extra fat: butter, olive oil, or coconut oil to add more calories to meals.   Consider updated VSS to better understand swallow function and get recommended food & liquid texture that will be best tolerated.   Increase fluid intake to at least 6-8 cups daily.  For constipation relief, add more insoluble fiber. Insoluble fiber doesn't dissolve in water and adds bulk to stool, which helps food pass more quickly through the stomach and intestines. Foods sources: Wheat bran, whole wheat flour, brown rice, bulgur, almonds, walnuts, sunflower seeds, broccoli, cauliflower, green beans, potatoes with skin, spinach, kale, and carrots. Another option for constipation relief is 1/2 cap of MiraLAX nightly, if food sources of insoluble fiber are not an option.  Add  multivitamin with minerals daily.     Pt level of understanding: Introductory level.  Monitoring and Evaluation:   Follow up in 3 months, provided call center phone number in AVS.    Nat Ruffing, MS, RD    ---------------------(data below generated by Nat Laurian Ruffing, RD)--------------------     Patient Verification & Telemedicine Consent & Financial Waiver:    1.   Identity: I have verified this patient's identity to be accurate.  2.   Consent: I verify consent has been secured in one of the following methods: (a) obtained written/ online attestation consent (via MyChartVideoVisit pathway), (b) the spoke-side provider has obtained verbal or written consent from patient/surrogate (if this is a provider to  provider evaluation), or (c) in all other cases, I have personally obtained verbal consent from the patient/ surrogate (noting all elements below) to perform this voluntary telemedicine evaluation (including obtaining history, performing examination and reviewing data provided by the patient).   The patient/ surrogate has the right to refuse this evaluation.  I have explained risks (including potential loss of confidentiality), benefits, alternatives, and the potential need for subsequent face to face care. Patient/ surrogate understands that there is a risk of medical inaccuracies given that our recommendations will be made based on reported data (and we must therefore assume this information is accurate).  Knowing that there is a risk that this information is not reported accurately, and that the telemedicine video, audio, or data feed may be incomplete, the patient agrees to proceed with evaluation and holds us  harmless knowing these risks.  3.   Healthcare Team: The patient/ surrogate has been notified that other healthcare professionals (including students, residents and engineer, maintenance) may be involved in this audio-video evaluation.   All laws concerning confidentiality and patient access  to medical records and copies of medical records apply to telemedicine.  4.   Privacy: If this is a Restaurant Manager, Fast Food Visit, the patient/ surrogate has received the [place] Notice of Privacy Practices via E-Checkin process.  For all other video visit techniques, I have verbally provided the patient/ surrogate with the [facility] web link in English (https://health.Ddotcom.si.aspx) or Spanish (https://health.[email or web address].aspx).  The patient/ surrogate acknowledges both being provided the NPP link, and has been offered to have the NPP mailed to the patient/ surrogate by [place]  mail.  The patient/ surrogate has voiced understanding an acknowledgement of receipt of this NPP web address.  If the patient/surrogate has elected to receive the NPP via US  mail, I verify that the NPP will be sent promptly to the patient/surrogate via [place]  mail.  5.   Capacity: I have reviewed this above verification and consent paragraph with the patient/ surrogate and the patient is capacitated or has a surrogate. If the patient is not capacitated to understand the above, and no surrogate is available, since this is not an emergency evaluation, the visit will be rescheduled until such time that the patient can consent, or the surrogate is available to consent. If this is an emergency evaluation and the patient is not capacitated to understand the above, and no surrogate is available, I am proceeding with this evaluation as this is felt to be an emergency setting and no appropriate specialist is available at the bedside to perform these evaluations.  6.   Financial Waiver: If this is a Restaurant Manager, Fast Food Visit, the patient has been made aware of the financial waiver via E-Checkin process.  For all other video visit techniques, an E-Checkin process is not performed.  As such, I have personally verbally informed the patient/ surrogate that this evaluation will be a billable encounter similar to an in-person clinic visit, and the  patient/ surrogate has agreed to pay the fee for services rendered.  If we are billing insurance for the patient's telehealth visit, his out-of-pocket cost will be determined based on his plan and will be billed to him.    7.   Intra-State Location: The patient/ surrogate attests to understanding that if the patient accesses these services from a location outside of [place] , that the patient does so at the patient's own risk and initiative and that the patient is ultimately responsible for compliance with any laws or regulations associated with the patient's use.  8.  Specific Use:The patient/ surrogate understands that [facility] makes no representation that materials or servicesdelivered via telecommunication services, or listed on telemedicine websites, are appropriate or available for use in any other location.           Demographics:  Medical Record #: [id number]  Date: September 24, 2023  Patient Name: Elijah Park  DOB: Jan 21, 2001  Age: 23 year old  Sex: male  Location: Home address on file  Patient seen Status: Patient was evaluated via telemedicine (audio or audio/video)     Evaluator(s):  Lain Safdar Tamblyn was evaluated by me today.    Clinic Location: [facility]  [facility]  [address] [place] [address]

## 2023-09-24 NOTE — Patient Instructions (Addendum)
 Hi Elijah Park,    [place] meeting with you today! Thank you for allowing me the opportunity to speak with you about nutrition. Below are your goals we discussed in session:    Aim for 2 meals & [employer] 1.4 via oral intake 2-3x/day to help better meet estimated kcal/protein needs. Sample of [employer]ion 1.4 supplement sent to home address in file).  These shakes can be blended into smoothies with ingredients like avocado, nut butter, yogurt, or oats, which add healthy fats and additional protein. For savory options, the shakes can be mixed into soups or pureed dishes like mashed potatoes or casseroles. Additionally, they can be added to coffee, tea, or even oatmeal for a nutritious boost. Adding shakes to baked goods such as muffins or pancakes can also be an effective way to increase calories without changing the meal's texture too much. For snacks, consider using the shakes as a base for puddings or popsicles, offering an easy, enjoyable way to meet dietary goals.  Incorporate calorie dense foods into meals: Full fat dairy (as tolerated) -whole milk, full-fat yogurt, cheese, and cream. Healthy Fats - avocados, nuts & nut butters, and olive oil. Whole grains - quinoa, oats, whole wheat bread, and brown rice. Cook with extra fat: butter, olive oil, or coconut oil to add more calories to meals.   Consider updated VSS to better understand swallow function and get recommended food & liquid texture that will be best tolerated.   Increase fluid intake to at least 6-8 cups daily.  For constipation relief, add more insoluble fiber. Insoluble fiber doesn't dissolve in water and adds bulk to stool, which helps food pass more quickly through the stomach and intestines. Foods sources: Wheat bran, whole wheat flour, brown rice, bulgur, almonds, walnuts, sunflower seeds, broccoli, cauliflower, green beans, potatoes with skin, spinach, kale, and carrots. Another option for constipation relief is 1/2 cap of MiraLAX nightly,  if food sources of insoluble fiber are not an option.  Add multivitamin with minerals daily.     Please let me know if there is anything that I can clarify. Please call [phone] if you would like to schedule a follow up appointment.    In good health,    Nat Ruffing, MS, RD

## 2023-09-28 ENCOUNTER — Encounter (INDEPENDENT_AMBULATORY_CARE_PROVIDER_SITE_OTHER): Payer: Self-pay

## 2023-09-28 ENCOUNTER — Encounter (HOSPITAL_BASED_OUTPATIENT_CLINIC_OR_DEPARTMENT_OTHER): Payer: Self-pay

## 2023-09-28 DIAGNOSIS — G7111 Myotonic muscular dystrophy: Secondary | ICD-10-CM

## 2023-09-29 ENCOUNTER — Encounter (HOSPITAL_COMMUNITY): Payer: Self-pay

## 2023-09-30 ENCOUNTER — Encounter (INDEPENDENT_AMBULATORY_CARE_PROVIDER_SITE_OTHER): Payer: Self-pay | Admitting: Hospital

## 2023-09-30 ENCOUNTER — Encounter (INDEPENDENT_AMBULATORY_CARE_PROVIDER_SITE_OTHER): Payer: Self-pay | Admitting: Critical Care

## 2023-09-30 DIAGNOSIS — G7100 Muscular dystrophy, unspecified: Secondary | ICD-10-CM

## 2023-10-01 ENCOUNTER — Other Ambulatory Visit (HOSPITAL_COMMUNITY): Payer: Self-pay

## 2023-10-01 MED ORDER — TASIMELTEON 20 MG PO CAPS
20.0000 mg | ORAL_CAPSULE | Freq: Every evening | ORAL | 3 refills | Status: AC
  Filled 2023-10-01 – 2023-10-02 (×3): qty 30, 30d supply, fill #0

## 2023-10-02 ENCOUNTER — Other Ambulatory Visit (HOSPITAL_COMMUNITY): Payer: Self-pay

## 2023-10-05 ENCOUNTER — Other Ambulatory Visit (HOSPITAL_COMMUNITY): Payer: Self-pay

## 2023-10-06 ENCOUNTER — Other Ambulatory Visit (HOSPITAL_COMMUNITY): Payer: Self-pay

## 2023-10-08 ENCOUNTER — Other Ambulatory Visit (HOSPITAL_COMMUNITY): Payer: Self-pay

## 2023-10-23 NOTE — Telephone Encounter (Signed)
 Pt met with [facility] & referral was used. Closing encounter.

## 2023-10-30 ENCOUNTER — Telehealth (INDEPENDENT_AMBULATORY_CARE_PROVIDER_SITE_OTHER): Payer: Self-pay | Admitting: Nurse Practitioner

## 2023-10-30 NOTE — Telephone Encounter (Signed)
 Outbound call made to Pt and LVM notifying Pt we need to r/s Appt due to Provider been out of the office in the PM. [facility] when Pt calls back please assist with r/s Appt to next available [facility] Appt with Provider. Thank you

## 2023-10-30 NOTE — Telephone Encounter (Signed)
 2nd attempt LVM in regards to the message listed below.

## 2023-11-03 ENCOUNTER — Encounter (INDEPENDENT_AMBULATORY_CARE_PROVIDER_SITE_OTHER): Payer: Self-pay | Admitting: Optometrist

## 2023-11-03 NOTE — Telephone Encounter (Signed)
**  No more optom clinic on Saturdays effective 12/08/23. MyChart message sent to pt.

## 2023-11-03 NOTE — Telephone Encounter (Signed)
 3rd attempt LVM notifying Pt Appt has been cx. [facility] when Pt calls back please assist with r/s Appt. Thank you

## 2023-11-23 ENCOUNTER — Ambulatory Visit (INDEPENDENT_AMBULATORY_CARE_PROVIDER_SITE_OTHER): Admitting: Nurse Practitioner

## 2023-12-18 ENCOUNTER — Encounter (INDEPENDENT_AMBULATORY_CARE_PROVIDER_SITE_OTHER): Payer: Self-pay | Admitting: Hospital

## 2023-12-24 ENCOUNTER — Encounter (INDEPENDENT_AMBULATORY_CARE_PROVIDER_SITE_OTHER): Payer: Self-pay

## 2023-12-24 ENCOUNTER — Ambulatory Visit (INDEPENDENT_AMBULATORY_CARE_PROVIDER_SITE_OTHER)

## 2023-12-24 ENCOUNTER — Other Ambulatory Visit (INDEPENDENT_AMBULATORY_CARE_PROVIDER_SITE_OTHER)

## 2023-12-24 VITALS — BP 98/65 | HR 53 | Temp 98.2°F | Ht 68.0 in | Wt 107.0 lb

## 2023-12-24 DIAGNOSIS — G7111 Myotonic muscular dystrophy: Secondary | ICD-10-CM | POA: Insufficient documentation

## 2023-12-24 LAB — VITAMIN B12, BLOOD: Vitamin B12: 365 pg/mL (ref 232–1245)

## 2023-12-24 LAB — COMPREHENSIVE METABOLIC PANEL, BLOOD
ALT (SGPT): 36 U/L (ref 0–50)
AST (SGOT): 48 U/L (ref 0–50)
Albumin: 4.5 g/dL (ref 3.5–5.2)
Alkaline Phos: 58 U/L (ref 40–129)
Anion Gap: 10 mmol/L (ref 7–15)
BUN: 15 mg/dL (ref 6–20)
Bicarbonate: 28 mmol/L (ref 22–29)
Bilirubin, Tot: 0.56 mg/dL (ref <1.2)
Calcium: 9.7 mg/dL (ref 8.5–10.6)
Chloride: 105 mmol/L (ref 98–107)
Creatinine: 1.2 mg/dL — ABNORMAL HIGH (ref 0.67–1.17)
Glucose: 86 mg/dL (ref 70–99)
Potassium: 5.7 mmol/L — ABNORMAL HIGH (ref 3.5–5.1)
Sodium: 143 mmol/L (ref 136–145)
Total Protein: 7.2 g/dL (ref 6.0–8.0)
eGFR Based on CKD-EPI 2021 Equation: >60 mL/min/1.73 m2

## 2023-12-24 LAB — LIPID(CHOL FRACT) PANEL, BLOOD
Cholesterol: 156 mg/dL (ref <200)
HDL-Cholesterol: 51 mg/dL
LDL-Chol (Calc): 94 mg/dL (ref <160)
Non-HDL Cholesterol: 105 mg/dL
Triglycerides: 55 mg/dL (ref 10–170)

## 2023-12-24 LAB — TSH, BLOOD: TSH: 0.9 u[IU]/mL (ref 0.27–4.20)

## 2023-12-24 LAB — GLYCOSYLATED HGB(A1C), BLOOD: Glyco Hgb (A1C): 5.1 % (ref 4.8–5.6)

## 2023-12-24 NOTE — Progress Notes (Signed)
 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 He is having less hypergag reflex   Weakness- feels like this stable , no falls , not tripping   Cardiac- deneis current symptoms -he did see Dr. Daun   Respiratory: some SOB with waking up, orthopnea, did see pulmonary   Myotonia: not taking mexiletine. He has mild symptoms but feels manageable   Sleep/fatigue- pending sleep study, he is now on tasimelteon and this is helping significantly  Cognitive- no change in memory   GI symptoms - still getting constipation , did recently have esophageal spasm recently -has not had an appt   Vision- has not seen eye doctor yet, feels like this is stable , if he gets tired, will get out of focus     Myotonic Dystrophy ROS:  Weakness: grip strength is predominantly affected , had trouble overusing muscles, facial muscles -does have some trouble with chewing, swallowing, has to avoid dry food- avoids bread/steak, has not had a swallow study or speech therapy , has trouble with a hypergag reflex, had this in 2020 and tried gabapentin for this and it helped but no longer happening. He is only able to eat one meal a day, started using boost, he has not lost a lot of weight , he is having some foot weakness, no falls but fatigue with walking   Cardiac symptoms: June 2024, HR 42, sinus bradycardia , denies palpitations, chest pain, some chest muscle spasms  Respiratory symptoms: he denies shortness of breath, orthopnea   Myotonia:  does w/ hand, jaw locks up in the morning and has slurred speech . He takes mexiletine 250 mg in AM and in PM, does get some acid reflux with this , tried nexium/tums   Sleep/fatigue: his sleep is fragmented, will sleep excessively, modafinal made him stay awake but would sleep for 12 hours , he has fallen asleep while driving   Cognitive: Denies memory changes, feels like has had poor memory issues  Infertility/Diabetes:  GI symptoms: He does get constipation, not taking anything for this, does not drink a lot of water , food gets stuck in chest   Vision/cataracts: no current vision changes, astigmatism, wears contacts   Hearing impairment: he does have bad hearing       08/27/2023:  Symptoms still about the same , feels that weakness is about the same, this is same   He is not sure about myotonia, not sure if it is limiting his activity , still not on mexiletine   HR 43 on recent EKG, pending echocardiogram, pending event monitor , no cardiac symptoms  Used to run, always had low HR   He does have SOB -more out of breath with laying flat, elevates himself   Sleep still sleeping issues, naps would  be around 6 hours, still an issue , tried modafinil in the past and not sure it helped , sometimes snoring   He feels same or less with weight   Did speech therapy but not nutrition   Blurred vision with sleep   Constipation is doing ok .    Last clinic visit   He reports some worsening with symptoms with cold weather.     Weakness: reports about the same, , if he stays upright and having liquids after this is better, also had some acid reflux. Still present but not as badm   Myotonia: more frequent myotonia, maybe due to old, not taking his mexiletine because it was not helping   Cardiac symptoms: no changes , referral to cardiology , last visit echo in July 2023 was normal , prolonged HR monitor low 33 HR at lowest, no other cahnges   Respiratory: spirometry showed MEP and MIP low, does have worsening shortness of breath  with laying down.     ENT said that vocal cords were ok.   He does feel that he is losing weight, he was only eating one meal a day. Has not been able to get nutrition follow up   Sleep has been about the same, sleeping more then he should , going to bed around 12-1 , sleeping in evenings      Swallow study   Patient presents with minimal oropharyngeal dysphagia. Intact safety. Mild efficiency impairment due to incomplete epiglottic inversion with liquids, and subtle pharyngeal weakness, resulting in vallecular, and posterior hypopharyngeal wall residue, which cleared with liquid wash. When gagging sensation occurred, oropharynx was clear. AP screen was notable for mild stasis at level of clavicles; I question if this is triggers his gagging sensation. AP screen also notable for slow clearance through distal esophagus.       Initial history:  He had onset of symptoms with grip weakness in December 2022, diagnosed in 2023. No family history. After he was diagnosed, his father had a low repeat, he is in his 70s, now starting to have symptoms.     Myotonic Dystrophy ROS:  Weakness: grip strength is predominantly affected , had trouble overusing muscles, facial muscles -does have some trouble with chewing, swallowing, has to avoid dry food- avoids bread/steak, has not had a swallow study or speech therapy , has trouble with a hypergag reflex, had this in 2020 and tried gabapentin for this and it helped but no longer happening. He is only able to eat one meal a day, started using boost, he has not lost a lot of weight , he is having some foot weakness, no falls but fatigue with walking   Cardiac symptoms: June 2024, HR 42, sinus bradycardia , denies palpitations, chest pain, some chest muscle spasms  Respiratory symptoms: he denies shortness of breath, orthopnea   Myotonia: does w/ hand, jaw locks up in the morning and has slurred speech . He takes mexiletine 250 mg in AM and in PM, does get some acid reflux with this  , tried nexium/tums   Sleep/fatigue: his sleep is fragmented, will sleep excessively, modafinal made him stay awake but would sleep for 12 hours , he has fallen asleep while driving   Cognitive: Denies memory changes, feels like has had poor memory issues  Infertility/Diabetes:  GI symptoms: He does get constipation, not taking anything for this, does not drink a lot of water , food gets stuck in chest   Vision/cataracts: no current vision changes, astigmatism,  wears contacts   Hearing impairment: he does have bad hearing       He is majoring in Engineer, manufacturing systems, 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 His family is in [place] , sister lives in [place] area       Plan per Dr. Kip   - Referral to [facility] clinic, OT through [facility]  - Annual eye exam and cardiology exam  - Trial of low dose Periactin for appetite, discussed that he can take nightly initially to try and regulate his  sleep cycle a bit as well  - Pending clinical progress will consider adding daytime Ritalin for excessive daytime somnolence  -Sleep clinic referral to discuss additional options for his nighttime insomnia and excessive daytime  somnolence  - Mexiletine 250 mg BID  - Follow up TSH, A 1 C, and lipid profile yearly with PCP  -Interested in clinical trials/ASO trials, may be a candidate if he takes time off school, recommended  AVIDITY study as a possibility  -[facility] MDA clinic in January 2025    Reviewed past medical history:      Reviewed social history:  Was using THC for sleep and myotonia but stopped this       Social History     Socioeconomic History    Marital status: Single     Spouse name: Not on file    Number of children: Not on file    Years of education: Not on file    Highest education level:  Not on file   Occupational History    Not on file   Tobacco Use    Smoking status: Never    Smokeless tobacco: Never   Substance and Sexual Activity    Alcohol use: Not on file    Drug use: Not on file    Sexual activity: Not on file   Other Topics Concern    Not on file   Social History Narrative    Not on file     Social Drivers of Health     Financial Resource Strain: Patient Declined (12/30/2022)    Received from [facility], [facility]    Overall Financial Resource Strain (CARDIA)     Difficulty of Paying Living Expenses: Patient declined   Food Insecurity: Patient Declined (12/30/2022)    Received from [facility], [facility]    Hunger Vital Sign     Worried About Running Out of Food in the Last Year: Patient declined     Ran Out of Food in the Last Year: Patient declined   Transportation Needs: Patient Declined (12/30/2022)    Received from [facility], [facility]    PRAPARE - Transportation     Lack of Transportation (Medical): Patient declined     Lack of Transportation (Non-Medical): Patient declined   Physical Activity: Not on file   Stress: Not on file   Social Connections: Unknown (10/22/2021)    Received from [facility], [facility]    Social Network     Social Network: Not on file   Intimate Partner Violence: Unknown (09/13/2021)    Received from [facility], [facility]    Intimate Partner Violence     Fear of  Current or Ex-Partner: Not asked     Emotionally Abused: Not asked     Physically Abused: Not asked     Sexually Abused: Not asked   Housing Stability: Patient Declined (12/30/2022)    Received from [facility], [facility]    Housing Stability Vital Sign     Unable to Pay for Housing in the Last Year: Patient declined     Number of Places Lived in the Last Year: 0     Unstable Housing in the Last Year: Patient declined           No Known Allergies   mexiletine Take 1 capsule (250 mg) by mouth.      Multiple Vitamin (MULTIVITAMIN ADULT PO) 1 tablet by Oral route daily.       Tasimelteon Take 1 capsule by mouth at bedtime.       No birth history on file.    Reviewed family history:   No family history on file.    Examination:    VITAL SIGNS: BP 98/65 (BP Location: Left arm, BP Patient Position: Sitting, BP cuff size: Regular)   Pulse 53   Temp 98.2 F (36.8 C) (Temporal)   Ht 5' 8 (1.727 m)   Wt 48.5 kg (107 lb)   SpO2 99%   BMI 16.27 kg/m    General:     NEUROLOGIC EXAMINATION:  Mental Status:  The patient was alert and oriented    Cranial Nerves:  EOM intact. Visual fields were intact to confrontation. Pupils were equal, round, and reactive to light. There was no abnormal nystagmus. Facial sensation symmetric.SABRA Hearing intact to finger rub b/l. Palate elevation symmetric.  5/5 strength in trapezius b/l. Tongue midline.     Moderate facial weakness, temporal wasting     Motor:   N  Upper Extremity   Rating (R/L) Lower Extremity  (on table) Rating (R/L)    Neck extensors      Neck flexors Previously 4     Deltoid 5/5 Hip Flexors    Int Rotators * Hip Extensors  4/4   Ext Rotators * Hip Adductors    Biceps 5/5 Hip Abductors    Triceps 5/5 Quadriceps    Wrist Flexors  Hamstring  5/5   Wrist Extensors  Ankle Dorsiflexors 4/4+   Finger flexion (digits II/III)  Ankle Plantarflexors 5/5   Finger flexion (digits IV/V)  Ankle Everters     Finger Extensors 4+/4+ Ankle Inverters    Interossei 5-/5- EHL *   APB  *     Supination  *     Pronation   *        *     *Not tested    Data:  I have personally reviewed patient's labs -  HgA1c 5.1  TSH Noraml  Lipid HDL low at 51  B12 low at 772**    From outside chart   CTG repeats 200-250 on DMPK     TTE 12/2021: Left Ventricle: Systolic function is normal. EF: 65-70%.  Left Ventricle: Left ventricle size is normal.   Left Ventricle: Wall thickness is normal.  EKG 12/2021: sinus bradycardia    MRI Cspine 06/14/2021:  Reported normal   and T spine 2022:  Acute-subacute compression fractures T5 and %6     Impression:  Pt is a 23 year old male who  presents to establish care for myotonic dystrophy type I, currently in school here at [employer].    Reviewed plan below, doing better  overall, sleep is much better     Did not see GI but nutrition has improved, RD reccommended repeat swallow study but patient deferred     Spirometry with some mild changes, could be responsible for voice , saw pulmonary, will continue to follow     Patient with persistent bradycardia, did see cardiology who is planning on doing repeat event monitor, echo, EKG stable except with bradycardia    Patient deferred mexiletine    Plan:    Continue to follow with cardiology   2. PFT testing w/ mild changes -can follow with pulmonary but has not symptoms   3. Last EKG 08/2023- saw cardiology, repeat Echo, event monitor   4. Sleep improved on new medication   6. Mexiletine 250 mg BID,- not taking as he feels this is manageable   7. Ophthalmology referral for blurred vision again   8. Repeat blood work   9. Multidsiplinary clinic with dr. Jeanice -given information for clinic trials at [facility]:    [place] :  [id number]  Name: [employer]  Phone Number: [phone]  Email: [email or web address]  [phone] for [place]     [place] : Del[employer] Etedesiran  (ASO)  Name: [employer]  Phone Number: [phone]  Email: [email or web address]      Return to clinic in 6 months .        Lateria Alderman, DO  Neuromuscular Staff     I personally spent total 30  minutes in face-to-face and non-face-to-face activities  related to the patient's visit today, excluding any separately reportable  services/procedures

## 2023-12-24 NOTE — Patient Instructions (Addendum)
 [place] :  [id number]  Name: [employer]  Phone Number: [phone]  Email: [email or web address]  [phone] for [place]     [place] : [employer]ir[employer]  (ASO)  Name: [employer]  Phone Number: [phone]  Email: [email or web address]    [place]:  Name: Medical Information  Phone Number: [phone]  Email: [email or web address]    Clinicaltrials.gov

## 2023-12-26 DIAGNOSIS — Z0100 Encounter for examination of eyes and vision without abnormal findings: Secondary | ICD-10-CM | POA: Diagnosis not present

## 2023-12-27 ENCOUNTER — Encounter (INDEPENDENT_AMBULATORY_CARE_PROVIDER_SITE_OTHER): Payer: Self-pay

## 2023-12-27 DIAGNOSIS — G7111 Myotonic muscular dystrophy: Secondary | ICD-10-CM

## 2023-12-29 ENCOUNTER — Ambulatory Visit (HOSPITAL_COMMUNITY): Payer: Commercial Managed Care - POS | Admitting: Internal Medicine

## 2024-01-08 NOTE — Progress Notes (Deleted)
 Sleep Lab Screening Tool    Point     0 Patients who do not need assistance    1 Patient who may need a translator, Trach, Oxygen more than 5 liters 0   2 Patient in a wheelchair with a caregiver 0   3 Quadriplegic/Paraplegic 0   4 Complex patients: Down Syndrome, Dementia, Non-Verbal, Neuromuscular. Easy agitated 4    Total Score 4     Patients scoring 4 or more points will be assigned a 1:1 ratio, patient to technician.        Comments/Notes:    Irregular sleep schedule  Neuromuscular  OSA

## 2024-01-14 ENCOUNTER — Encounter (INDEPENDENT_AMBULATORY_CARE_PROVIDER_SITE_OTHER): Payer: Self-pay

## 2024-01-14 NOTE — Telephone Encounter (Signed)
 Routing message to provider to be reviewed.   Harm Jou E. MA Neurology.

## 2024-01-18 NOTE — Telephone Encounter (Signed)
 Pt called to provide [place] address    [address]   [id number]  [place] [address]

## 2024-01-18 NOTE — Telephone Encounter (Signed)
 Left message with [place], need [place]  address.    Arleene

## 2024-01-19 NOTE — Telephone Encounter (Signed)
 DMV placard application has been completed, scanned in to media and mailed to patients [place] address.    Arleene

## 2024-01-20 ENCOUNTER — Encounter (INDEPENDENT_AMBULATORY_CARE_PROVIDER_SITE_OTHER)

## 2024-01-30 ENCOUNTER — Encounter (INDEPENDENT_AMBULATORY_CARE_PROVIDER_SITE_OTHER): Admitting: Optometrist

## 2024-02-01 NOTE — Telephone Encounter (Signed)
Pt pick up form

## 2024-02-01 NOTE — Telephone Encounter (Signed)
 FYI    Caller: Patient  Phone # [phone]  Provider: Dr. Zale    Pt is following up on previous messages for [facility] placard application, pt states has not received letter at [facility] address which was confirmed by pt.    Pt states will be coming in today to pick up the application between 2pm and 3pm, pt is requesting to please print out completed application.     Thank you.

## 2024-02-01 NOTE — Telephone Encounter (Signed)
 Printed and placed in the front cabinet under A.    Arleene

## 2024-04-04 ENCOUNTER — Telehealth (INDEPENDENT_AMBULATORY_CARE_PROVIDER_SITE_OTHER): Payer: Self-pay | Admitting: Optometrist

## 2024-04-04 NOTE — Telephone Encounter (Signed)
 Scheduled at [facility] tomorrow with Dr. Coye

## 2024-04-04 NOTE — Telephone Encounter (Signed)
 TRIAGE SYMPTOMS / POST- OP SYMPTOM FORM      Have you recently had surgery, procedure or injection? No - Route message to Urgent Care Pool   (If surgery took place somewhere else, and was within the last 90 days, ask the patient to go back to treating physician and refuse scheduling).   Type and date of surgery/procedure:     What is your problem or medical question? Patient right eye is red and swollen painful no discharg senitive to light can't open eye    (if patient calling from out of town with urgent/emergent concern please tell them to go the nearest emergency room - we cannot provide emergency referrals to providers outside of [facility])    When did your problem begin? 2 days ago    Are you being referred by an outside provider? No    Has the symptom worsened, improved, or remained unchanged? Worsen    Does it affect one eye or both? Right     Has your vision changed? No    Loss of vision? No Constant/Intermittent?    If yes, describe loss:    Flashes? No  Floaters? No  Shadows or spiderweb in peripheral vision?     Eye pain? Yes   If yes, Location, description, intensity in a scale 0-10:6    Has the pain worsened, improved, or remained unchanged? Worsen  Did nausea and vomiting accompany the pain?  No   Is there any other type of pain?  No, If yes,     Are your eyes red? Yes   Has redness worsened, improved, or remained unchanged? Worsen    Discharge from the eye? No   If yes, describe: N/A  Eyelids stick together?     Any burn/injury to the eye, forehead, or face? No   Eyelid damaged?   Describe how burn/injury occurred:     Do you wear contact lens? Yes Glasses? Yes    Any other problem? No   If Yes, explain:       *Insurance (PLEASE COMPLETE BEFORE ROUTING)    Does Patient have Insurance? Yes    Insurance Type? E-AETNA/AETNA CHOICE POS II   Was Insurance verified and added to patient registration? Yes  Was there a STAT referral on file? No  Referral number:     If HMO selected, does patient  have an authorization? N/A  For "Yes" answer, provide Authorization/Referral number: N/A  For "No" answer, please advise patient Authorization from insurance is needed for scheduling & service.     If Medi-Cal selected, does patient have straight Medi-Cal? N/A  If "No", was patient eligible for VSP benefits? N/A  If "Yes" does patient have "VSP Essential" benefits? N/A  Was VSP added to patient registration? N/A    *IF NO INSURANCE IS BEING USED OR NO PRIOR AUTHORIZATION AVAILABLE, MAKE SURE TO DOCUMENT SELF PAY. ADVISE PATIENT THEY WILL NEED TO SIGN A LETTER OF AGREEMENT, A $300 DEPOSIT WILL BE COLLECTED DURING CHECK-IN, AND PATIENT WILL BE FINANCIALLY RESPONSIBLE FOR THE VISIT.

## 2024-04-04 NOTE — Telephone Encounter (Signed)
 Please have pt be scheduled to urgent care tomorrow at [facility] or [facility]. Please offer soonest availability

## 2024-04-05 ENCOUNTER — Ambulatory Visit (INDEPENDENT_AMBULATORY_CARE_PROVIDER_SITE_OTHER)

## 2024-04-05 DIAGNOSIS — H00021 Hordeolum internum right upper eyelid: Secondary | ICD-10-CM

## 2024-04-05 MED ORDER — AMOXICILLIN-POT CLAVULANATE 500-125 MG OR TABS
1.0000 | ORAL_TABLET | Freq: Two times a day (BID) | ORAL | 0 refills | Status: AC
Start: 2024-04-05 — End: 2024-04-12

## 2024-04-05 NOTE — Patient Instructions (Signed)
-  start augmentin 500mg  twice daily for 7 days  -avoid contact lens wear for the next 7 days  -if unresolved, schedule OCP NA    Lid Hygiene  -Clean your upper and lower lids with baby shampoo- once a day    Warm Compresses  -Use heated compresses (Bruder, Tranquileyes, Medibeads, Derm, USB plugged, ocusoft premium, etc.)   -Alternatively, can use sock filled with uncooked rice.   -Use 8-10 minutes four times daily.  -Followed by massage with finger for 30-60 seconds. Do NOT try to pop stye.  Note: you can use your warm compresses more times a day.

## 2024-04-05 NOTE — Progress Notes (Signed)
 Ophthalmology Service  OPTOMETRY PROGRESS NOTE      HPI    Reasons for Visit: urgent care    CC: redness, swelling, discharge, light sensitivity OD x 3 days. Pt states eye hurts pretty bad. He thinks it is a stye, he mentions that it is on the same place where he had stye surgically removed 3 years ago. Getting slightly better. Pt normally wears daily CL. Pt reports good compliance with changing lenses daily/ not sleeping in lenses.    Ocular Medications: None    Ocular History:   --Chalazion RUL    Family Ocular History:  [-] Glaucoma:  [-] AMD:  [-] RD:  [-] Other:     Medical Dx: myotonic dystrophy   Last edited by Coye Randine CROME, OD on 04/05/2024 10:29 AM.          Assessment/ Plan:   1. Hordeolum internum of right upper eyelid (Primary)  -start augmentin 500mg  twice daily for 7 days  -avoid contact lens wear for the next 7 days  -if unresolved, schedule OCP NA    Lid Hygiene  -Clean your upper and lower lids with baby shampoo- once a day    Warm Compresses  -Use heated compresses (Bruder, Tranquileyes, Medibeads, Derm, USB plugged, ocusoft premium, etc.)   -Alternatively, can use sock filled with uncooked rice.   -Use 8-10 minutes four times daily.  -Followed by massage with finger for 30-60 seconds. Do NOT try to pop stye.  Note: you can use your warm compresses more times a day.     - amoxicillin-clavulanate (AUGMENTIN) 500-125 MG tablet; Take 1 tablet by mouth 2 times daily for 7 days.  Dispense: 14 tablet; Refill: 0      Return if symptoms worsen or fail to improve.    Randine Coye, OD  Lic #[id number]

## 2024-04-11 ENCOUNTER — Encounter (INDEPENDENT_AMBULATORY_CARE_PROVIDER_SITE_OTHER): Payer: Self-pay | Admitting: Hospital

## 2024-04-14 NOTE — Interdisciplinary (Deleted)
 Return Ventilator Pre-Visit Planning.    [x]  Reconciled medications, allergies, immunizations, and Care Everywhere records.   []  Followed up on previous ROI.   [x]  Pended all necessary orders.   Prior work-up completed?   []  None ordered   []  Yes completed   [x]  Not completed -- reminder sent.   []  DME Data Downloaded.    Harlene Jenkins Nida, April 14, 2024 10:40 AM

## 2024-04-18 ENCOUNTER — Encounter (INDEPENDENT_AMBULATORY_CARE_PROVIDER_SITE_OTHER): Admitting: Critical Care

## 2024-04-18 ENCOUNTER — Other Ambulatory Visit (INDEPENDENT_AMBULATORY_CARE_PROVIDER_SITE_OTHER)

## 2024-04-22 ENCOUNTER — Encounter (INDEPENDENT_AMBULATORY_CARE_PROVIDER_SITE_OTHER): Admitting: Critical Care

## 2024-04-22 DIAGNOSIS — G4719 Other hypersomnia: Secondary | ICD-10-CM

## 2024-05-02 ENCOUNTER — Encounter (INDEPENDENT_AMBULATORY_CARE_PROVIDER_SITE_OTHER): Payer: Self-pay | Admitting: Hospital

## 2024-05-11 ENCOUNTER — Emergency Department
Admission: EM | Admit: 2024-05-11 | Discharge: 2024-05-11 | Disposition: A | Discharge status: Home / Self Care | Attending: Emergency Medicine | Admitting: Emergency Medicine

## 2024-05-11 DIAGNOSIS — E860 Dehydration: Secondary | ICD-10-CM | POA: Insufficient documentation

## 2024-05-11 DIAGNOSIS — Z0000 Encounter for general adult medical examination without abnormal findings: Secondary | ICD-10-CM

## 2024-05-11 DIAGNOSIS — G7111 Myotonic muscular dystrophy: Secondary | ICD-10-CM | POA: Insufficient documentation

## 2024-05-11 DIAGNOSIS — R112 Nausea with vomiting, unspecified: Secondary | ICD-10-CM | POA: Insufficient documentation

## 2024-05-11 LAB — URINALYSIS WITH CULTURE REFLEX, WHEN INDICATED
Bilirubin: NEGATIVE
Blood: NEGATIVE
Glucose: NEGATIVE
Ketones: NEGATIVE
Leuk Esterase: NEGATIVE Leu/uL
Nitrite: NEGATIVE
Specific Gravity: 1.033 — ABNORMAL HIGH (ref 1.002–1.030)
Urobilinogen: NEGATIVE
pH: 6 (ref 5.0–8.0)

## 2024-05-11 LAB — CBC WITH DIFF, BLOOD
ANC-Automated: 7.8 1000/mm3 — ABNORMAL HIGH (ref 1.6–7.0)
Abs Basophils: 0 1000/mm3 (ref <0.2)
Abs Eosinophils: 0.1 1000/mm3 (ref 0.0–0.5)
Abs Lymphs: 2.2 1000/mm3 (ref 0.8–3.1)
Abs Monos: 0.8 1000/mm3 (ref 0.2–0.8)
Basophils: 0.2 %
Eosinophils: 0.6 %
Hct: 48.1 % (ref 40.0–50.0)
Hgb: 16.2 g/dL (ref 13.7–17.5)
Imm Gran %: 0.4 % (ref <1)
Imm Gran Abs: 0 1000/mm3 (ref <0.1)
Lymphocytes: 20 %
MCH: 26.3 pg (ref 26.0–32.0)
MCHC: 33.7 g/dL (ref 32.0–36.0)
MCV: 78.2 um3 — ABNORMAL LOW (ref 79.0–95.0)
MPV: 11.6 fL (ref 9.4–12.4)
Monocytes: 7.6 %
Plt Count: 185 1000/mm3 (ref 140–370)
RBC: 6.15 mill/mm3 — ABNORMAL HIGH (ref 4.60–6.10)
RDW: 12.8 % (ref 12.0–14.0)
Segs: 71.2 %
WBC: 10.9 1000/mm3 — ABNORMAL HIGH (ref 4.0–10.0)

## 2024-05-11 LAB — COMPREHENSIVE METABOLIC PANEL, BLOOD
ALT (SGPT): 21 U/L (ref 0–50)
AST (SGOT): 33 U/L (ref 0–50)
Albumin: 4.7 g/dL (ref 3.5–5.2)
Alkaline Phos: 56 U/L (ref 40–129)
Anion Gap: 13 mmol/L (ref 7–15)
BUN: 16 mg/dL (ref 6–20)
Bicarbonate: 25 mmol/L (ref 22–29)
Bilirubin, Tot: 0.7 mg/dL (ref <1.2)
Calcium: 9.7 mg/dL (ref 8.5–10.6)
Chloride: 105 mmol/L (ref 98–107)
Creatinine: 1.13 mg/dL (ref 0.67–1.17)
Glucose: 94 mg/dL (ref 70–99)
Potassium: 4.5 mmol/L (ref 3.5–5.1)
Sodium: 143 mmol/L (ref 136–145)
Total Protein: 7.4 g/dL (ref 6.0–8.0)
eGFR Based on CKD-EPI 2021 Equation: >60 mL/min/1.73 m2

## 2024-05-11 LAB — SARS-COV-2, INFLUENZA A/B, AND RSV PCR, RRL
Influenza A PCR, RRL: NOT DETECTED
Influenza B PCR, RRL: NOT DETECTED
Respiratory Syncytial Virus (RSV) PCR, RRL: NOT DETECTED
SARS-CoV-2 PCR, RRL: NOT DETECTED

## 2024-05-11 LAB — HIV 1/2 ANTIBODY & P24 ANTIGEN ASSAY, BLOOD: HIV 1/2 Antibody & P24 Antigen Assay: NONREACTIVE

## 2024-05-11 LAB — HCV ANTIBODY WITH REFLEX QUANT: Hepatitis C Ab: NONREACTIVE

## 2024-05-11 LAB — LIPASE, BLOOD: Lipase: 33 U/L (ref 13–60)

## 2024-05-11 MED ORDER — ONDANSETRON HCL 4 MG/2ML IV SOLN
4.0000 mg | Freq: Once | INTRAMUSCULAR | Status: AC
  Administered 2024-05-11: 4 mg via INTRAVENOUS
  Filled 2024-05-11: qty 2

## 2024-05-11 MED ORDER — SODIUM CHLORIDE 0.9 % IV SOLN: Freq: Once | INTRAVENOUS | Status: AC

## 2024-05-11 NOTE — ED Provider Notes (Signed)
 Here head ED Provider Note  [facility] electronic medical record reviewed for pertinent medical history.     Elijah Park DOB: [birth year] PMD: No Pcp, Per Patient     Chief Complaint   Patient presents with    Nausea     Pt woke up this AM and had multiple episodes of vomiting with persistent nausea, mild abdominal pain.       HPI: Elijah Park is a 23 year old male who has no past medical history on file. Patient is a 23 year old male with a history of myotonic dystrophy type 1, which has been stable and typically followed by Neurology. The patient woke up this morning with nausea and several episodes of vomiting without any real abdominal pain. He has had no diarrhea. He has had no recent travel history did feel some chills but no true fever. He states he ate pasta with Alfredo sauce last night but his roommate ate the same meal and did not get sick either. He denies any diarrhea. No recent travel history. No new medications.    He is feeling somewhat better but denies any abdominal pain. He does smoke marijuana occasionally but has not so since October that time only did it intermittently.      External Data Sources (Select all that apply):  primary care clinic note from (institution & date) primary care clinic notes reviewed  and Neurology. Information obtained: Previous medical history shows use.SABRA    Pertinent Medical History:    PMHx: As above    Past Surgical History[1]    Family History[2]    Current Outpatient Medications   Medication Instructions    mexiletine (MEXITIL) 250 mg    Multiple Vitamin (MULTIVITAMIN ADULT PO) 1 tablet, DAILY    Tasimelteon 20 MG CAPS 1 capsule, AT BEDTIME       Physical Exam  BP 101/74   Pulse 59   Temp 97.4 F (36.3 C)   Resp 14   Ht 5' 8 (1.727 m)   Wt (!) 47.9 kg (105 lb 9.6 oz)   SpO2 100%   BMI 16.06 kg/m   Physical Exam  Constitutional:       Appearance: Normal appearance.   HENT:      Head: Normocephalic and atraumatic.      Mouth/Throat:      Mouth:  Mucous membranes are moist.   Eyes:      Extraocular Movements: Extraocular movements intact.      Pupils: Pupils are equal, round, and reactive to light.   Cardiovascular:      Rate and Rhythm: Normal rate and regular rhythm.   Abdominal:      General: Abdomen is flat.      Palpations: Abdomen is soft.   Musculoskeletal:         General: Normal range of motion.   Skin:     General: Skin is warm and dry.   Neurological:      General: No focal deficit present.      Mental Status: He is alert and oriented to person, place, and time.   Psychiatric:         Mood and Affect: Mood normal.         Behavior: Behavior normal.             Orders/Medications    Orders Placed This Encounter   Procedures    CBC w/ Diff Lavender    Comprehensive Metabolic Panel    HIV 1/2 Antibody &  P24 Antigen Assay    Syphilis Screen, Blood Yellow serum separator tube    Urinalysis with Culture Reflex, when indicated       Medications   sodium chloride 0.9 % 1,000 mL IV bolus (has no administration in time range)   ondansetron (ZOFRAN) injection 4 mg (has no administration in time range)       Medical Decision Making/Assessment/Plan    This is a(n) 23 year old male who has no past medical history on file. and presents with several episodes of vomiting this morning with no real abdominal pain and without fever at this time. Patient looks mildly dehydrated but has absolutely no abdominal tenderness on exam and no surgical findings. We will move forward with IV hydration work, .                     ED Course:  13:55  patient feeling much better, taking p.o. fluids and abdomen remains benign. Will discharge at this time patient does have oral Zofran at home so can continue to take that for vomiting. Suspicion for significant intra-abdominal processes low at this time.  Treatments:  Medications   sodium chloride 0.9 % 1,000 mL IV bolus (has no administration in time range)   ondansetron (ZOFRAN) injection 4 mg (has no administration in time range)      ED Clinical Impression as of 05/11/24 1355   Nausea and vomiting, unspecified vomiting type       Consult Orders:  None  None    I have discussed care with the consultant None  listed above.      ED Diagnoses:    Vomiting pertinent           Data Reviewed:      Risk of Complications and/or Morbidity:  Risk - Drugs : Parenteral administration of controlled substances  Her neck            Disposition    DISCHARGE AND FOLLOW UP:  The patient appears to be stable at the time of reevaluation and discharge.  Reexamination of the patient does not reveal any new abnormal findings.  Extensive verbal and written instructions were given to the patient.  Emphasis on follow up with primary care physician within 24-48 hours was provided.  The patient was directed to return to the Emergency Department immediately if there were any new or worsening symptoms, and the specific potential concerning symptoms were individually discussed and described in my usual and customary manner.         Hadassah Neville Fujita, MD            [1] No past surgical history on file.  [2] No family history on file.       Joselin Crandell, Hadassah Neville, MD  05/11/24 1356

## 2024-05-11 NOTE — ED Notes (Signed)
Bed: R2  Expected date:   Expected time:   Means of arrival:   Comments:

## 2024-05-11 NOTE — Discharge Instructions (Signed)
 Make sure that you advance your diet slowly, use Zofran as discussed, and avoid heavy meals greasy foods irritating stomach foods.    Your lab testing here was negative and your flu and COVID test are negative also.    Use Zofran every 6-8 hours as needed for nausea and you can return for any worsening symptoms or concerns.

## 2024-05-11 NOTE — ED Notes (Signed)
 First encounter with patient. Pt was able to ambulate from waiting room to bed R2. Pt is a 23 y/o male with hx of myotonic dystrophy type 1 on medication who presents to ED with cc: n/v and mild abd pain this morning. Reports little to dull pain in abdomen. Denies diarrhea/constipation. States unable to keep po medications down. Denie sill contacts at home. States he started to feel weird last night-unsure if he ingested anything to cause him n/v. Aao x 4. MAE x 4. Vss.

## 2024-05-11 NOTE — ED Notes (Signed)
 Pt provided urine collection kit at this time, aware specimen needed when able.

## 2024-05-12 ENCOUNTER — Telehealth (INDEPENDENT_AMBULATORY_CARE_PROVIDER_SITE_OTHER): Payer: Self-pay

## 2024-05-12 LAB — SYPHILIS EIA SCREEN, BLOOD: Syphilis Screen: NEGATIVE

## 2024-05-12 NOTE — Telephone Encounter (Signed)
 [facility] RN Urgent Care/ER Telephone Follow-up:    Reason for visit: n/v, abd pain    Did the student call the [facility] Nurse Advice/Triage Line prior to presenting to the ER?: []     Facility: [x]  [facility] []  [facility] []  [facility]  []  [facility] []  [facility] (specify location)    Time/Date of visit/admission: 05/11/24 9090    Time/Date of discharge (ER only): 05/11/24 1408    UC/ER Recommended treatment plan: Labs, 1 L NS bolus, zofran . Use zofran  q 6-8 hrs, advance diet slowly, avoid greasy food and heavy meals. Return for worsening symptoms/concerns.    Patient response to care/status since last seen: Unknown, awaiting response    Patient Education: NA--awaiting response    Instructions for follow-up:     [x]  An additional follow-up call (please place back on call-back list)  []  Follow-up at [facility] (please specify I.e. Urgent Care, Same-day, PCP visit, insurance etc.)  []  Referrals made if required  []  Specialty appointment (please specify)  []  Referral to [facility]/CAPS  []  Other campus resource (please specify)  []  No further action required     Nidia Nat Pressman, RN -  [facility]

## 2024-05-13 ENCOUNTER — Telehealth (INDEPENDENT_AMBULATORY_CARE_PROVIDER_SITE_OTHER): Payer: Self-pay

## 2024-05-13 NOTE — Telephone Encounter (Signed)
 [facility] RN Urgent Care/ER Telephone Follow-up:    ATTEMPT #2     Reason for visit: n/v, abd pain     Did the student call the [facility] Nurse Advice/Triage Line prior to presenting to the ER?: []      Facility: [x]  [facility] []  [facility] []  [facility]  []  [facility] []  [facility] (specify location)     Time/Date of visit/admission: 05/11/24 9090     Time/Date of discharge (ER only): 05/11/24 1408     UC/ER Recommended treatment plan: Labs, 1 L NS bolus, zofran . Use zofran  q 6-8 hrs, advance diet slowly, avoid greasy food and heavy meals. Return for worsening symptoms/concerns.     Patient response to care/status since last seen: Unknown, awaiting response     Patient Education: NA--awaiting response     Instructions for follow-up:      []  An additional follow-up call (please place back on call-back list)  []  Follow-up at [facility] (please specify I.e. Urgent Care, Same-day, PCP visit, insurance etc.)  []  Referrals made if required  []  Specialty appointment (please specify)  []  Referral to [facility]/CAPS  []  Other campus resource (please specify)  [x]  No further action required      Nidia Nat Pressman, RN - [facility]

## 2024-05-27 ENCOUNTER — Other Ambulatory Visit: Payer: Self-pay | Admitting: Internal Medicine

## 2024-05-27 MED ORDER — OSELTAMIVIR PHOSPHATE 75 MG PO CAPS: 75.0000 mg | ORAL_CAPSULE | Freq: Two times a day (BID) | ORAL | 0 refills | Status: AC

## 2024-05-27 MED ORDER — BALOXAVIR MARBOXIL(40 MG DOSE) 1 X 40 MG PO TBPK: 40.0000 mg | ORAL_TABLET | Freq: Once | ORAL | 0 refills | Status: AC

## 2024-06-06 ENCOUNTER — Other Ambulatory Visit (HOSPITAL_COMMUNITY): Payer: Self-pay

## 2024-06-06 MED ORDER — ALBUTEROL SULFATE HFA 108 (90 BASE) MCG/ACT IN AERS
2.0000 | INHALATION_SPRAY | RESPIRATORY_TRACT | 3 refills | Status: AC | PRN
  Filled 2024-06-06: qty 6.7, 17d supply, fill #0

## 2024-06-07 ENCOUNTER — Other Ambulatory Visit (HOSPITAL_COMMUNITY): Payer: Self-pay

## 2024-06-17 ENCOUNTER — Other Ambulatory Visit (HOSPITAL_COMMUNITY): Payer: Self-pay

## 2024-06-24 ENCOUNTER — Encounter (INDEPENDENT_AMBULATORY_CARE_PROVIDER_SITE_OTHER): Payer: Self-pay | Admitting: Hospital

## 2024-06-30 ENCOUNTER — Encounter (INDEPENDENT_AMBULATORY_CARE_PROVIDER_SITE_OTHER): Payer: Self-pay

## 2024-06-30 ENCOUNTER — Ambulatory Visit (INDEPENDENT_AMBULATORY_CARE_PROVIDER_SITE_OTHER)

## 2024-06-30 VITALS — BP 114/60 | HR 51 | Temp 97.6°F | Resp 16 | Ht 68.0 in | Wt 104.0 lb

## 2024-06-30 DIAGNOSIS — G7111 Myotonic muscular dystrophy: Secondary | ICD-10-CM

## 2024-06-30 DIAGNOSIS — G1220 Motor neuron disease, unspecified: Secondary | ICD-10-CM

## 2024-06-30 MED ORDER — MEXILETINE HCL 150 MG OR CAPS: 150.0000 mg | ORAL_CAPSULE | Freq: Two times a day (BID) | ORAL | 2 refills | Status: AC

## 2024-06-30 NOTE — Progress Notes (Signed)
 PATIENT:  Elijah Park   MRN:         [id number]   DOB:          03-14-2001     NEUROMUSCULAR CLINIC Follow up     REASON FOR REFERRAL: Myotonic Dystrophy Type I  REFERRING PHYSICIAN: Dr. Kip     Initial history:  Patient is a 24 year old male who is presenting with chief complaint of myotonic dystrophy type I.     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     07/01/2023:  Symptoms were worse in [place] , flu type B, more trouble breathing, this has slightly improved , both at rest and with activity  Able to lie flat but has to elevate   More issues with swallowing- 3-4 swallows, he is avoiding thicker food/bread.   He is not coughing with liquids   He has lost more weight- 104/107-about 4 pounds  Appetite is ok , not doing boost or ensure right now   Thinks foot has gotten weaker, not falling at all   Cardiac- has not seen them recently- next one is in June   Myotonia- not on mexiletine , myotonia-stays longer   Cognitive- no memory changes  Sleep- have not done sleep study   GI- better constipation  Vision- when he gets tired- more blurred   Still having esophageal spasms   Feels ok with driving       2/82/7974:  Did see pulmonary - plan CXR and PSG w/ TCO2  He is doing more liquid calories -using boost   Feels that weight is up , gained 4 pounds   No coughing or choking. He is having less hypergag reflex   Weakness- feels like this stable , no falls , not tripping   Cardiac- deneis current symptoms -he did see Dr. Daun   Respiratory: some SOB with waking up, orthopnea, did see pulmonary   Myotonia: not taking mexiletine. He has mild symptoms but feels manageable   Sleep/fatigue- pending sleep study, he is now on tasimelteon and this is helping significantly  Cognitive- no change in memory   GI symptoms - still getting constipation , did recently have esophageal spasm recently -has not had an appt   Vision- has not seen eye doctor yet, feels like this is stable , if he gets tired, will  get out of focus     Myotonic Dystrophy ROS:  Weakness: grip strength is predominantly affected , had trouble overusing muscles, facial muscles -does have some trouble with chewing, swallowing, has to avoid dry food- avoids bread/steak, has not had a swallow study or speech therapy , has trouble with a hypergag reflex, had this in 2020 and tried gabapentin for this and it helped but no longer happening. He is only able to eat one meal a day, started using boost, he has not lost a lot of weight , he is having some foot weakness, no falls but fatigue with walking   Cardiac symptoms: June 2024, HR 42, sinus bradycardia , denies palpitations, chest pain, some chest muscle spasms  Respiratory symptoms: he denies shortness of breath, orthopnea   Myotonia: does w/ hand, jaw locks up in the morning and has slurred speech . He takes mexiletine 250 mg in AM and in PM, does get some acid reflux with this , tried nexium/tums   Sleep/fatigue: his sleep is fragmented, will sleep excessively, modafinal made him stay awake but would sleep for 12 hours , he has fallen  asleep while driving   Cognitive: Denies memory changes, feels like has had poor memory issues  Infertility/Diabetes:  GI symptoms: He does get constipation, not taking anything for this, does not drink a lot of water , food gets stuck in chest   Vision/cataracts: no current vision changes, astigmatism, wears contacts   Hearing impairment: he does have bad hearing       08/27/2023:  Symptoms still about the same , feels that weakness is about the same, this is same   He is not sure about myotonia, not sure if it is limiting his activity , still not on mexiletine   HR 43 on recent EKG, pending echocardiogram, pending event monitor , no cardiac symptoms  Used to run, always had low HR   He does have SOB -more out of breath with laying flat, elevates himself   Sleep still sleeping issues, naps would be around 6 hours, still an issue , tried modafinil in the past and not  sure it helped , sometimes snoring   He feels same or less with weight   Did speech therapy but not nutrition   Blurred vision with sleep   Constipation is doing ok .    Last clinic visit   He reports some worsening with symptoms with cold weather.     Weakness: reports about the same, , if he stays upright and having liquids after this is better, also had some acid reflux. Still present but not as badm   Myotonia: more frequent myotonia, maybe due to old, not taking his mexiletine because it was not helping   Cardiac symptoms: no changes , referral to cardiology , last visit echo in July 2023 was normal , prolonged HR monitor low 33 HR at lowest, no other cahnges   Respiratory: spirometry showed MEP and MIP low, does have worsening shortness of breath with laying down.     ENT said that vocal cords were ok.   He does feel that he is losing weight, he was only eating one meal a day. Has not been able to get nutrition follow up   Sleep has been about the same, sleeping more then he should , going to bed around 12-1 , sleeping in evenings      Swallow study   Patient presents with minimal oropharyngeal dysphagia. Intact safety. Mild efficiency impairment due to incomplete epiglottic inversion with liquids, and subtle pharyngeal weakness, resulting in vallecular, and posterior hypopharyngeal wall residue, which cleared with liquid wash. When gagging sensation occurred, oropharynx was clear. AP screen was notable for mild stasis at level of clavicles; I question if this is triggers his gagging sensation. AP screen also notable for slow clearance through distal esophagus.       Initial history:  He had onset of symptoms with grip weakness in December 2022, diagnosed in 2023. No family history. After he was diagnosed, his father had a low repeat, he is in his 86s, now starting to have symptoms.     Myotonic Dystrophy ROS:  Weakness: grip strength is predominantly affected , had trouble overusing muscles, facial muscles  -does have some trouble with chewing, swallowing, has to avoid dry food- avoids bread/steak, has not had a swallow study or speech therapy , has trouble with a hypergag reflex, had this in 2020 and tried gabapentin for this and it helped but no longer happening. He is only able to eat one meal a day, started using boost, he has not lost a lot of weight ,  he is having some foot weakness, no falls but fatigue with walking   Cardiac symptoms: June 2024, HR 42, sinus bradycardia , denies palpitations, chest pain, some chest muscle spasms  Respiratory symptoms: he denies shortness of breath, orthopnea   Myotonia: does w/ hand, jaw locks up in the morning and has slurred speech . He takes mexiletine 250 mg in AM and in PM, does get some acid reflux with this , tried nexium/tums   Sleep/fatigue: his sleep is fragmented, will sleep excessively, modafinal made him stay awake but would sleep for 12 hours , he has fallen asleep while driving   Cognitive: Denies memory changes, feels like has had poor memory issues  Infertility/Diabetes:  GI symptoms: He does get constipation, not taking anything for this, does not drink a lot of water , food gets stuck in chest   Vision/cataracts: no current vision changes, astigmatism, wears contacts   Hearing impairment: he does have bad hearing       He is majoring in engineer, manufacturing systems, 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 His family is in [place] , sister lives in [place] area       Plan per Dr. Kip   - Referral to [facility] clinic, OT through [facility]  - Annual eye exam and cardiology exam  - Trial of low dose Periactin for appetite, discussed that he can take nightly initially to try and regulate his  sleep cycle a bit as well  - Pending clinical  progress will consider adding daytime Ritalin for excessive daytime somnolence  -Sleep clinic referral to discuss additional options for his nighttime insomnia and excessive daytime  somnolence  - Mexiletine 250 mg BID  - Follow up TSH, A 1 C, and lipid profile yearly with PCP  -Interested in clinical trials/ASO trials, may be a candidate if he takes time off school, recommended  [facility] study as a possibility  -[facility] MDA clinic in January 2025    Reviewed past medical history:      Reviewed social history:  Was using THC for sleep and myotonia but stopped this       Social History     Socioeconomic History    Marital status: Single     Spouse name: Not on file    Number of children: Not on file    Years of education: Not on file    Highest education level: Not on file   Occupational History    Not on file   Tobacco Use    Smoking status: Never    Smokeless tobacco: Never   Substance and Sexual Activity    Alcohol use: Not on file    Drug use: Not on file    Sexual activity: Not on file   Other Topics Concern    Not on file   Social History Narrative    Not on file     Social Drivers of Health     Financial Resource Strain: Patient Declined (12/30/2022)    Received from [facility]    Overall Financial Resource Strain (CARDIA)     Difficulty of Paying Living Expenses: Patient declined   Food Insecurity: Patient Declined (12/30/2022)    Received from [facility]    Hunger Vital Sign     Within the past 12 months, you worried that your food would run out before you got the money to buy more.: Patient declined     Within the past 12 months, the food you bought just didn't last and you didn't have money to get more.: Patient declined   Transportation Needs: Patient Declined (12/30/2022)    Received from [facility] - Transportation     Lack of Transportation (Medical): Patient declined     Lack of Transportation (Non-Medical): Patient declined   Physical Activity: Not on file   Stress: Not on file   Social  Connections: Unknown (09/13/2021)    Received from [facility]    Social Connections     Frequency of Communication with Friends and Family: Not asked     Frequency of Social Gatherings with Friends and Family: Not asked   Intimate Partner Violence: Unknown (09/13/2021)    Received from [facility]    Intimate Partner Violence     Fear of Current or Ex-Partner: Not asked     Emotionally Abused: Not asked     Physically Abused: Not asked     Sexually Abused: Not asked   Housing Stability: Patient Declined (12/30/2022)    Received from [facility] Stability Vital Sign     Unable to Pay for Housing in the Last Year: Patient declined     Number of Places Lived in the Last Year: 0     Unstable Housing in the Last Year: Patient declined           No Known Allergies   mexiletine Take 1 capsule (250 mg) by mouth.      Multiple Vitamin (MULTIVITAMIN ADULT PO) 1 tablet by Oral route daily.      Tasimelteon Take 1 capsule by mouth at bedtime.       No birth history on file.    Reviewed family history:   Family History   Problem Relation Name Age of Onset    Diabetes Father Elijah Park        Examination:    VITAL SIGNS: BP 114/60 (BP Location: Right arm, BP Patient Position: Sitting, BP cuff size: Pediatric)   Pulse 51   Temp 97.6 F (36.4 C) (Temporal)   Resp 16   Ht 5' 8 (1.727 m)   Wt (!) 47.2 kg (104 lb)   SpO2 99%   BMI 15.81 kg/m    General:     NEUROLOGIC EXAMINATION:  Mental Status:  The patient was alert and oriented    Cranial Nerves:  EOM intact. Facial sensation symmetric..Tongue midline.     Moderate facial weakness, temporal wasting     Motor:   N  Upper Extremity   Rating (R/L) Lower Extremity  (on table) Rating (R/L)    Neck extensors 5     Neck flexors 5     Deltoid 5/5 Hip Flexors    Int Rotators * Hip Extensors  4/4   Ext Rotators * Hip Adductors    Biceps 5/5 Hip Abductors    Triceps 4+/4+ Quadriceps    Wrist Flexors  Hamstring  5/5   Wrist Extensors  Ankle Dorsiflexors 4/4+   Finger  flexion (digits II/III)  Ankle Plantarflexors 5/5   Finger flexion (digits IV/V)  Ankle Everters     Finger Extensors 4+/4+ Ankle Inverters    Interossei 4+/5- EHL *   APB  *  Supination  *     Pronation   *        *     *Not tested    Data:  I have personally reviewed patient's labs -  HgA1c 5.1  TSH Noraml  Lipid HDL low at 51  B12 low at 772**    From outside chart   CTG repeats 200-250 on DMPK     TTE 12/2021: Left Ventricle: Systolic function is normal. EF: 65-70%.  Left Ventricle: Left ventricle size is normal.   Left Ventricle: Wall thickness is normal.  EKG 12/2021: sinus bradycardia    MRI Cspine 06/14/2021:  Reported normal   and T spine 2022:  Acute-subacute compression fractures T5 and %6     Impression:  Pt is a 24 year old male who presents for myotonic dystrophy type I, currently in school here at [employer].    Reviewed plan below    Worsening SOB after flu B, recheck spirometry and follow up with pulmonary.     Did not see GI - will try mexiletine for esophageal spasms, continues to lose weight, recommended restarting boost/ensure, see RD. Also with worsening dysphagia, swallow study and speech referral     Patient with persistent bradycardia, did see cardiology who is planning on doing repeat event monitor (patient reports he did not do- sent message to cardiology, echo, EKG stable except with bradycardia      Plan:    Continue to follow with cardiology -follow up in March and do event monitor  2. Repeat spirometry and see pulm   3. Last EKG 08/2023- saw cardiology, repeat Echo, event monitor   4. Sleep improved on new medication   6. Mexiletine 150 mg BID,-see if this helps esophageal symptoms  8. Sent email to Dr/ Jeanice about DYNE study, patient did reach out to other sites below about trials and reports they were full         [place] :  [id number]  Name: [employer]  Phone Number: [phone]  Email: [email or web address]  [phone] for Elijah Park     [place] : [employer]  (ASO)  Name: [employer]  Phone Number: [phone]  Email: [email or web address]      Return to clinic in 3-4 months .      Corneisha Alvi, DO  Neuromuscular Staff     I personally spent total 40  minutes in face-to-face and non-face-to-face activities  related to the patients visit today, excluding any separately reportable  services/procedures

## 2024-07-01 ENCOUNTER — Encounter (HOSPITAL_COMMUNITY): Payer: Self-pay

## 2024-09-20 ENCOUNTER — Encounter (INDEPENDENT_AMBULATORY_CARE_PROVIDER_SITE_OTHER)
